# Patient Record
Sex: Female | Born: 1960
Health system: Southern US, Community
[De-identification: ages and names within clinical notes are randomized; demographics above are authoritative.]

## PROBLEM LIST (undated history)

## (undated) DIAGNOSIS — E785 Hyperlipidemia, unspecified: Secondary | ICD-10-CM

## (undated) DIAGNOSIS — M169 Osteoarthritis of hip, unspecified: Secondary | ICD-10-CM

## (undated) HISTORY — DX: Hyperlipidemia, unspecified: E78.5

## (undated) HISTORY — PX: BUNIONECTOMY: SHX129

## (undated) HISTORY — DX: Osteoarthritis of hip, unspecified: M16.9

## (undated) HISTORY — PX: TONSILLECTOMY AND ADENOIDECTOMY: SUR1326

## (undated) HISTORY — PX: OTHER SURGICAL HISTORY: SHX169

---

## 1999-03-17 ENCOUNTER — Emergency Department (HOSPITAL_COMMUNITY): Admission: EM | Admit: 1999-03-17 | Discharge: 1999-03-17 | Payer: Self-pay | Admitting: Emergency Medicine

## 1999-05-30 ENCOUNTER — Other Ambulatory Visit: Admission: RE | Admit: 1999-05-30 | Discharge: 1999-05-30 | Payer: Self-pay | Admitting: Obstetrics and Gynecology

## 2000-05-16 ENCOUNTER — Other Ambulatory Visit: Admission: RE | Admit: 2000-05-16 | Discharge: 2000-05-16 | Payer: Self-pay | Admitting: Obstetrics and Gynecology

## 2001-05-19 ENCOUNTER — Other Ambulatory Visit: Admission: RE | Admit: 2001-05-19 | Discharge: 2001-05-19 | Payer: Self-pay | Admitting: Obstetrics and Gynecology

## 2002-06-02 ENCOUNTER — Other Ambulatory Visit: Admission: RE | Admit: 2002-06-02 | Discharge: 2002-06-02 | Payer: Self-pay | Admitting: Obstetrics and Gynecology

## 2003-07-15 ENCOUNTER — Other Ambulatory Visit: Admission: RE | Admit: 2003-07-15 | Discharge: 2003-07-15 | Payer: Self-pay | Admitting: Obstetrics and Gynecology

## 2004-05-02 ENCOUNTER — Ambulatory Visit: Payer: Self-pay | Admitting: Internal Medicine

## 2004-07-11 ENCOUNTER — Other Ambulatory Visit: Admission: RE | Admit: 2004-07-11 | Discharge: 2004-07-11 | Payer: Self-pay | Admitting: Obstetrics and Gynecology

## 2005-07-23 ENCOUNTER — Other Ambulatory Visit: Admission: RE | Admit: 2005-07-23 | Discharge: 2005-07-23 | Payer: Self-pay | Admitting: Obstetrics and Gynecology

## 2006-02-28 ENCOUNTER — Emergency Department (HOSPITAL_COMMUNITY): Admission: EM | Admit: 2006-02-28 | Discharge: 2006-02-28 | Payer: Self-pay | Admitting: Family Medicine

## 2006-03-18 ENCOUNTER — Emergency Department (HOSPITAL_COMMUNITY): Admission: EM | Admit: 2006-03-18 | Discharge: 2006-03-18 | Payer: Self-pay | Admitting: Emergency Medicine

## 2008-03-10 ENCOUNTER — Encounter: Admission: RE | Admit: 2008-03-10 | Discharge: 2008-03-10 | Payer: Self-pay | Admitting: Obstetrics and Gynecology

## 2008-08-18 ENCOUNTER — Ambulatory Visit: Payer: Self-pay | Admitting: Internal Medicine

## 2008-08-18 LAB — CONVERTED CEMR LAB
HDL goal, serum: 40 mg/dL
LDL Goal: 160 mg/dL

## 2008-08-19 ENCOUNTER — Encounter: Payer: Self-pay | Admitting: Internal Medicine

## 2008-08-26 ENCOUNTER — Ambulatory Visit: Payer: Self-pay | Admitting: Internal Medicine

## 2008-08-29 ENCOUNTER — Encounter: Payer: Self-pay | Admitting: Internal Medicine

## 2008-08-29 ENCOUNTER — Encounter (INDEPENDENT_AMBULATORY_CARE_PROVIDER_SITE_OTHER): Payer: Self-pay | Admitting: *Deleted

## 2010-03-28 ENCOUNTER — Encounter: Payer: Self-pay | Admitting: Internal Medicine

## 2010-03-29 ENCOUNTER — Ambulatory Visit: Payer: Self-pay | Admitting: Internal Medicine

## 2010-03-29 DIAGNOSIS — E785 Hyperlipidemia, unspecified: Secondary | ICD-10-CM

## 2010-04-02 ENCOUNTER — Encounter: Payer: Self-pay | Admitting: Internal Medicine

## 2010-07-15 LAB — CONVERTED CEMR LAB
ALT: 17 units/L (ref 0–35)
Basophils Relative: 1.3 % (ref 0.0–3.0)
Bilirubin, Direct: 0.1 mg/dL (ref 0.0–0.3)
CO2: 28 meq/L (ref 19–32)
Calcium: 8.4 mg/dL (ref 8.4–10.5)
Creatinine, Ser: 0.7 mg/dL (ref 0.4–1.2)
Eosinophils Relative: 6.5 % — ABNORMAL HIGH (ref 0.0–5.0)
Glucose, Bld: 87 mg/dL (ref 70–99)
Hemoglobin: 12.5 g/dL (ref 12.0–15.0)
Lymphocytes Relative: 28.7 % (ref 12.0–46.0)
Monocytes Relative: 7.2 % (ref 3.0–12.0)
Neutro Abs: 2.1 10*3/uL (ref 1.4–7.7)
RBC: 3.85 M/uL — ABNORMAL LOW (ref 3.87–5.11)
Sodium: 141 meq/L (ref 135–145)
TSH: 2.72 microintl units/mL (ref 0.35–5.50)
Total Protein: 6.2 g/dL (ref 6.0–8.3)

## 2010-07-17 NOTE — Assessment & Plan Note (Signed)
Summary: cpx/cbs   Vital Signs:  Patient profile:   50 year old female Height:      64.25 inches (163.19 cm) Weight:      147 pounds (66.82 kg) BMI:     25.13 Temp:     98.2 degrees F (36.78 degrees C) oral Resp:     15 per minute BP sitting:   108 / 70  (left arm)  Vitals Entered By: Lucious Groves CMA (March 29, 2010 12:54 PM)  Is Patient Diabetic? No Pain Assessment Patient in pain? no      Comments Patient notes that she is schedule for mammogram and pap on 05-02-10. Patient flu and Tdap are done through work ([employer])./kb   History of Present Illness: Mrs Tafolla is here for a physical; she is asymptomatic.  Current Medications (verified): 1)  Nortrel 7/7/7 0.5/0.75/1-35 Mg-Mcg Tabs (Norethin-Eth Estrad Triphasic) .... As Directed 2)  Calcium .... Daily 3)  Vitamins .... 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  Past Surgical History: Tonsillectomy ;   G 1 P 1  Family History: Father: MI @ 32 Mother: HTN,dyslipidemia,asthmatic bronchitis Siblings: 3 sisters: dyslipidemia; 1 sister :DM; MGF: colon cancer; MGM : HTN  Social History: Occupation:RN IV, Educator for  [employer] Married Social smoking in college only Alcohol use-yes: socially Regular exercise-yes:weights , CVE 2X/ week  Review of Systems  The patient denies anorexia, fever, weight loss, weight gain, vision loss, decreased hearing, hoarseness, chest pain, syncope, dyspnea on exertion, peripheral edema, prolonged cough, headaches, abdominal pain, melena, hematochezia, severe indigestion/heartburn, hematuria, suspicious skin lesions, depression, unusual weight change, abnormal bleeding, enlarged lymph nodes, and angioedema.         Stool cards negative @ Gyn. Allergy:  Complains of sneezing; denies itching eyes; Intermittent symptoms X 6  months with clear rhinitis, worse in Summer, improving.  Physical Exam  General:  well-nourished;alert,appropriate and cooperative throughout examination Head:  Normocephalic and atraumatic without obvious abnormalities.  Eyes:  No corneal or conjunctival inflammation noted.  Perrla. Funduscopic exam benign, without hemorrhages, exudates or papilledema.  Ears:  External ear exam shows no significant lesions or deformities.  Otoscopic examination reveals clear canals, tympanic membranes are intact bilaterally without bulging, retraction, inflammation or discharge. Hearing is grossly normal bilaterally. Nose:  External nasal examination shows no deformity or inflammation. Nasal mucosa are pink and moist without lesions or exudates. Mouth:  Oral mucosa and oropharynx without lesions or exudates.  Teeth in good repair. Neck:  No deformities, masses, or tenderness noted. ? L cervical rib Lungs:  Normal respiratory effort, chest expands symmetrically. Lungs are clear to auscultation, no crackles or wheezes. Heart:  Normal rate and regular rhythm. S1 and S2 normal without gallop, murmur,  rub . ? intermittent click @ apex Abdomen:  Bowel sounds positive,abdomen soft and non-tender without masses, organomegaly or hernias noted. Genitalia:  Dr Arelia Sneddon Msk:  No deformity or scoliosis noted of thoracic or lumbar spine.   Pulses:  R and L carotid,radial,dorsalis pedis and posterior tibial pulses are full and equal bilaterally Extremities:  No clubbing, cyanosis, edema, or deformity noted with normal full range of motion of all joints.   Neurologic:  alert & oriented X3 and DTRs symmetrical and normal.   Skin:  Intact without suspicious lesions or rashes Cervical Nodes:  No lymphadenopathy noted Axillary Nodes:  No palpable lymphadenopathy Psych:  memory intact for recent and remote, normally interactive, and good eye contact.     Impression & Recommendations:  Problem # 1:  ROUTINE GENERAL MEDICAL  EXAM@[email or web address]  CARE FACL (ICD-V70.0)  Problem # 2:  HYPERLIPIDEMIA (ICD-272.4)  Problem # 3:  FAMILY HISTORY OF ISCHEMIC HEART DISEASE (ICD-V17.3) Father MI @ 61  Complete Medication List: 1)  Nortrel 7/7/7 0.5/0.75/1-35 Mg-mcg Tabs (Norethin-eth estrad triphasic) .... As directed 2)  Calcium  .... Daily 3)  Vitamins  .... Daily  Patient Instructions: 1)  It is important that you exercise regularly at least 20 minutes 5 times a week. If you develop chest pain, have severe difficulty breathing, or feel very tired , stop exercising immediately and seek medical attention. Please FAX ( [phone]) EKG & fasting labs. 2)  Take an 81 mg coated Aspirin every day.Vitamin D3 1000 International Units once daily & Calcium 600 mg two times a day . 3)  Schedule a colonoscopy to help detect colon cancer if insurance allows because of FH of colon cancer.   Not Administered:    Tetanus Vaccine not given due to: will get from employer    Influenza Vaccine # 1 not given due to: will get from employer

## 2010-07-19 NOTE — Letter (Signed)
Summary: Health Screening/Healthgram  Health Screening/[facility]   Imported By: Lanelle Bal 06/02/2010 09:08:16  _____________________________________________________________________  External Attachment:    Type:   Image     Comment:   External Document

## 2011-06-18 HISTORY — PX: COLONOSCOPY: SHX174

## 2011-09-12 ENCOUNTER — Other Ambulatory Visit (HOSPITAL_COMMUNITY): Payer: Self-pay | Admitting: Orthopedic Surgery

## 2011-09-12 DIAGNOSIS — M25551 Pain in right hip: Secondary | ICD-10-CM

## 2011-09-13 ENCOUNTER — Ambulatory Visit (HOSPITAL_COMMUNITY)
Admission: RE | Admit: 2011-09-13 | Discharge: 2011-09-13 | Disposition: A | Discharge status: Home / Self Care | Payer: 59 | Source: Ambulatory Visit | Attending: Orthopedic Surgery | Admitting: Orthopedic Surgery

## 2011-09-13 DIAGNOSIS — M24159 Other articular cartilage disorders, unspecified hip: Secondary | ICD-10-CM | POA: Insufficient documentation

## 2011-09-13 DIAGNOSIS — M25559 Pain in unspecified hip: Secondary | ICD-10-CM | POA: Insufficient documentation

## 2011-09-13 DIAGNOSIS — M2480 Other specific joint derangements of unspecified joint, not elsewhere classified: Secondary | ICD-10-CM | POA: Insufficient documentation

## 2011-09-13 DIAGNOSIS — M25551 Pain in right hip: Secondary | ICD-10-CM

## 2011-09-13 DIAGNOSIS — M24859 Other specific joint derangements of unspecified hip, not elsewhere classified: Secondary | ICD-10-CM | POA: Insufficient documentation

## 2011-09-20 ENCOUNTER — Encounter: Payer: Self-pay | Admitting: Internal Medicine

## 2011-10-31 ENCOUNTER — Encounter: Payer: Self-pay | Admitting: Internal Medicine

## 2011-12-16 ENCOUNTER — Encounter: Payer: Self-pay | Admitting: Internal Medicine

## 2011-12-16 ENCOUNTER — Ambulatory Visit (INDEPENDENT_AMBULATORY_CARE_PROVIDER_SITE_OTHER): Payer: 59 | Admitting: Internal Medicine

## 2011-12-16 VITALS — BP 122/64 | HR 68 | Temp 98.1°F | Resp 12 | Ht 64.08 in | Wt 149.6 lb

## 2011-12-16 DIAGNOSIS — Z23 Encounter for immunization: Secondary | ICD-10-CM

## 2011-12-16 DIAGNOSIS — Z1211 Encounter for screening for malignant neoplasm of colon: Secondary | ICD-10-CM

## 2011-12-16 DIAGNOSIS — Z0000 Encounter for general adult medical examination without abnormal findings: Secondary | ICD-10-CM

## 2011-12-16 DIAGNOSIS — E785 Hyperlipidemia, unspecified: Secondary | ICD-10-CM

## 2011-12-16 NOTE — Patient Instructions (Addendum)
Preventive Health Care: Eat a low-fat diet with lots of fruits and vegetables, up to 7-9 servings per day.  Consume less than 30 grams of sugar per day from foods & drinks with High Fructose Corn Syrup as #1,2,3 or #4 on label. Health Care Power of Attorney & Living Will place you in charge of your health care  decisions. Verify these are  in place.  As per the Standard of Care , screening Colonoscopy recommended @ 50 & every 5-10 years thereafter . More frequent monitor would be dictated by family history or findings @ Colonoscopy. Please  schedule fasting Labs : BMET,Lipids, hepatic panel, CBC & dif, TSH. Code : V70.0.  Please try to go on My Chart within the next 24 hours to allow me to release the results directly to you.

## 2011-12-16 NOTE — Progress Notes (Signed)
  Subjective:    Patient ID: Shelby Calhoun, female    DOB: Feb 02, 1961, 51 y.o.   MRN: [id number]  HPI Shelby Calhoun is here for a physical;acute issues DJD of R hip.      Review of Systems She has seen Shelby Calhoun who referred her to [facility] see Shelby Calhoun. He felt arthroscopy was not indicated and that she would need a total hip replacement. She does take glucosamine sulfate  Her advanced cholesterol panel was reviewed; there is risk with LDL greater than 100.There is  a family history of premature coronary disease in her father.    Objective:   Physical Exam Gen.: Thin healthy and well-nourished in appearance. Alert, appropriate and cooperative throughout exam. Head: Normocephalic without obvious abnormalities  Eyes: No corneal or conjunctival inflammation noted. Pupils equal round reactive to light and accommodation. Fundal exam is benign without hemorrhages, exudate, papilledema. Extraocular motion intact. Vision grossly normal. Ears: External  ear exam reveals no significant lesions or deformities. Canals clear .TMs normal. Hearing is grossly normal bilaterally. Nose: External nasal exam reveals no deformity or inflammation. Nasal mucosa are pink and moist. No lesions or exudates noted.   Mouth: Oral mucosa and oropharynx reveal no lesions or exudates. Teeth in good repair. Neck: No deformities, masses, or tenderness noted. Range of motion&  Thyroid normal. Lungs: Normal respiratory effort; chest expands symmetrically. Lungs are clear to auscultation without rales, wheezes, or increased work of breathing. Heart: Normal rate and rhythm. Normal S1 and S2. No gallop, click, or rub. S4 with slurring ; no  murmur. Abdomen: Bowel sounds normal; abdomen soft and nontender. No masses, organomegaly or hernias noted. Genitalia: Shelby Calhoun                                                         Musculoskeletal/extremities: No deformity or scoliosis noted of  the thoracic or lumbar spine. No  clubbing, cyanosis, edema, or deformity noted. Range of motion  normal .Tone & strength  normal.Joints normal. Nail health  good. Vascular: Carotid, radial artery, dorsalis pedis and  posterior tibial pulses are full and equal. No bruits present. Neurologic: Alert and oriented x3. Deep tendon reflexes symmetrical and normal.         Skin: Intact without suspicious lesions or rashes. Lymph: No cervical, axillary lymphadenopathy present. Psych: Mood and affect are normal. Normally interactive                                                                                         Assessment & Plan:  #1 comprehensive physical exam; no acute findings #2 see Problem List with Assessments & Recommendations #3 degenerative joint disease right hip; no clinical contraindication to surgery Plan: see Orders

## 2011-12-17 ENCOUNTER — Encounter: Payer: Self-pay | Admitting: Internal Medicine

## 2012-01-23 ENCOUNTER — Ambulatory Visit (AMBULATORY_SURGERY_CENTER): Payer: 59

## 2012-01-23 VITALS — Ht 64.0 in | Wt 151.1 lb

## 2012-01-23 DIAGNOSIS — Z1211 Encounter for screening for malignant neoplasm of colon: Secondary | ICD-10-CM

## 2012-01-23 MED ORDER — MOVIPREP 100 G PO SOLR: ORAL | Status: DC

## 2012-02-06 ENCOUNTER — Encounter: Payer: 59 | Admitting: Internal Medicine

## 2012-02-06 ENCOUNTER — Encounter: Payer: Self-pay | Admitting: Internal Medicine

## 2012-02-06 ENCOUNTER — Ambulatory Visit (AMBULATORY_SURGERY_CENTER): Payer: 59 | Admitting: Internal Medicine

## 2012-02-06 VITALS — BP 115/63 | HR 63 | Temp 99.3°F | Resp 15 | Ht 64.0 in | Wt 151.0 lb

## 2012-02-06 DIAGNOSIS — Z1211 Encounter for screening for malignant neoplasm of colon: Secondary | ICD-10-CM

## 2012-02-06 MED ORDER — SODIUM CHLORIDE 0.9 % IV SOLN: 500.0000 mL | INTRAVENOUS | Status: DC

## 2012-02-06 NOTE — Patient Instructions (Addendum)
The colonoscopy was normal! Excellent prep also.  Thank you for choosing me and [facility].  Iva Boop, MD, FACG YOU HAD AN ENDOSCOPIC PROCEDURE TODAY AT THE [facility]: Refer to the procedure report that was given to you for any specific questions about what was found during the examination.  If the procedure report does not answer your questions, please call your gastroenterologist to clarify.  If you requested that your care partner not be given the details of your procedure findings, then the procedure report has been included in a sealed envelope for you to review at your convenience later.  YOU SHOULD EXPECT: Some feelings of bloating in the abdomen. Passage of more gas than usual.  Walking can help get rid of the air that was put into your GI tract during the procedure and reduce the bloating. If you had a lower endoscopy (such as a colonoscopy or flexible sigmoidoscopy) you may notice spotting of blood in your stool or on the toilet paper. If you underwent a bowel prep for your procedure, then you may not have a normal bowel movement for a few days.  DIET: Your first meal following the procedure should be a light meal and then it is ok to progress to your normal diet.  A half-sandwich or bowl of soup is an example of a good first meal.  Heavy or fried foods are harder to digest and may make you feel nauseous or bloated.  Likewise meals heavy in dairy and vegetables can cause extra gas to form and this can also increase the bloating.  Drink plenty of fluids but you should avoid alcoholic beverages for 24 hours.  ACTIVITY: Your care partner should take you home directly after the procedure.  You should plan to take it easy, moving slowly for the rest of the day.  You can resume normal activity the day after the procedure however you should NOT DRIVE or use heavy machinery for 24 hours (because of the sedation medicines used during the test).    SYMPTOMS TO REPORT  IMMEDIATELY: A gastroenterologist can be reached at any hour.  During normal business hours, 8:30 AM to 5:00 PM Monday through Friday, call [phone].  After hours and on weekends, please call the GI answering service at [phone] who will take a message and have the physician on call contact you.   Following lower endoscopy (colonoscopy or flexible sigmoidoscopy):  Excessive amounts of blood in the stool  Significant tenderness or worsening of abdominal pains  Swelling of the abdomen that is new, acute  Fever of 100F or higher  FOLLOW UP: If any biopsies were taken you will be contacted by phone or by letter within the next 1-3 weeks.  Call your gastroenterologist if you have not heard about the biopsies in 3 weeks.  Our staff will call the home number listed on your records the next business day following your procedure to check on you and address any questions or concerns that you may have at that time regarding the information given to you following your procedure. This is a courtesy call and so if there is no answer at the home number and we have not heard from you through the emergency physician on call, we will assume that you have returned to your regular daily activities without incident.  SIGNATURES/CONFIDENTIALITY: You and/or your care partner have signed paperwork which will be entered into your electronic medical record.  These signatures attest to the fact that that  the information above on your After Visit Summary has been reviewed and is understood.  Full responsibility of the confidentiality of this discharge information lies with you and/or your care-partner.

## 2012-02-06 NOTE — Op Note (Signed)
[facility] [address] [place], [address]   COLONOSCOPY PROCEDURE REPORT  PATIENT: Calhoun, Shelby Sinnett  MR#: [id number] BIRTHDATE: May 11, 1961 , 50  yrs. old GENDER: Female ENDOSCOPIST: Iva Boop, MD, [facility] REFERRED XB:[id number] Alwyn Ren, M.D. PROCEDURE DATE:  02/06/2012 PROCEDURE:   Colonoscopy, screening ASA CLASS:   Class I INDICATIONS:average risk screening. MEDICATIONS: propofol (Diprivan) 350mg  IV, MAC sedation, administered by CRNA, and These medications were titrated to patient response per physician's verbal order  DESCRIPTION OF PROCEDURE:   After the risks benefits and alternatives of the procedure were thoroughly explained, informed consent was obtained.  A digital rectal exam revealed no abnormalities of the rectum.   The LB [id number]  endoscope was introduced through the anus and advanced to the cecum, which was identified by both the appendix and ileocecal valve. No adverse events experienced.   The quality of the prep was excellent, using MoviPrep  The instrument was then slowly withdrawn as the colon was fully examined.      COLON FINDINGS: A normal appearing cecum, ileocecal valve, and appendiceal orifice were identified.  The ascending, hepatic flexure, transverse, splenic flexure, descending, sigmoid colon and rectum appeared unremarkable.  No polyps or cancers were seen. Retroflexed views in the rectum and right colon revealed no abnormalities. The time to cecum=3 minutes 49 seconds  Withdrawal time=15 minutes 00 seconds.  The scope was withdrawn and the procedure completed. COMPLICATIONS: There were no complications. ENDOSCOPIC IMPRESSION: Normal colon and rectum with excellent prep  RECOMMENDATIONS: repeat Colonscopy in 10 years.  eSigned:  Iva Boop, MD, [facility] 02/06/2012 3:39 PM   cc: Pecola Lawless, MD and The Patient

## 2012-02-06 NOTE — Progress Notes (Addendum)
Patient did not have preoperative order for IV antibiotic SSI prophylaxis. (G8918)  Patient did not experience any of the following events: a burn prior to discharge; a fall within the facility; wrong site/side/patient/procedure/implant event; or a hospital transfer or hospital admission upon discharge from the facility. (G8907)  

## 2012-02-07 ENCOUNTER — Telehealth: Payer: Self-pay | Admitting: *Deleted

## 2012-02-07 NOTE — Telephone Encounter (Signed)
  Follow up Call-  Call back number 02/06/2012  Post procedure Call Back phone  # [phone]  Permission to leave phone message Yes     Patient questions:  Do you have a fever, pain , or abdominal swelling? no Pain Score  0 *  Have you tolerated food without any problems? yes  Have you been able to return to your normal activities? yes  Do you have any questions about your discharge instructions: Diet   no Medications  no Follow up visit  no  Do you have questions or concerns about your Care? no  Actions: * If pain score is 4 or above: No action needed, pain <4.

## 2012-04-03 ENCOUNTER — Telehealth: Payer: Self-pay

## 2012-04-03 NOTE — Telephone Encounter (Signed)
error 

## 2012-04-20 ENCOUNTER — Encounter (HOSPITAL_COMMUNITY): Payer: Self-pay | Admitting: Pharmacy Technician

## 2012-04-23 ENCOUNTER — Encounter (HOSPITAL_COMMUNITY)
Admission: RE | Admit: 2012-04-23 | Discharge: 2012-04-23 | Disposition: A | Discharge status: Home / Self Care | Payer: 59 | Source: Ambulatory Visit | Attending: Orthopedic Surgery | Admitting: Orthopedic Surgery

## 2012-04-23 ENCOUNTER — Encounter (HOSPITAL_COMMUNITY): Payer: Self-pay

## 2012-04-23 LAB — URINALYSIS, ROUTINE W REFLEX MICROSCOPIC
Bilirubin Urine: NEGATIVE
Hgb urine dipstick: NEGATIVE
Ketones, ur: 40 mg/dL — AB
Nitrite: NEGATIVE
Urobilinogen, UA: 1 mg/dL (ref 0.0–1.0)
pH: 6 (ref 5.0–8.0)

## 2012-04-23 LAB — CBC
MCH: 31.7 pg (ref 26.0–34.0)
MCHC: 33.9 g/dL (ref 30.0–36.0)
RDW: 11.4 % — ABNORMAL LOW (ref 11.5–15.5)

## 2012-04-23 LAB — BASIC METABOLIC PANEL
BUN: 10 mg/dL (ref 6–23)
Creatinine, Ser: 0.67 mg/dL (ref 0.50–1.10)
GFR calc Af Amer: >90 mL/min (ref >90)
GFR calc non Af Amer: >90 mL/min (ref >90)
Glucose, Bld: 92 mg/dL (ref 70–99)

## 2012-04-23 LAB — PROTIME-INR
INR: 0.96 (ref 0.00–1.49)
Prothrombin Time: 12.7 seconds (ref 11.6–15.2)

## 2012-04-23 LAB — SURGICAL PCR SCREEN
MRSA, PCR: POSITIVE — AB
Staphylococcus aureus: POSITIVE — AB

## 2012-04-23 LAB — HCG, SERUM, QUALITATIVE: Preg, Serum: NEGATIVE

## 2012-04-23 NOTE — H&P (Signed)
TOTAL HIP ADMISSION H&P  Patient is admitted for right total hip arthroplasty, anterior approach.  Subjective:  Chief Complaint:    Right hip OA / pain  HPI: Shelby Calhoun, 51 y.o. female, has a history of pain and functional disability in the right hip(s) due to arthritis and patient has failed non-surgical conservative treatments for greater than 12 weeks to include NSAID's and/or analgesics, corticosteriod injections and activity modification.  Onset of symptoms was gradual starting 2 years ago with rapidlly worsening course since that time.The patient noted no past surgery on the right hip(s).  Patient currently rates pain in the right hip at 7 out of 10 with activity. Patient has worsening of pain with activity and weight bearing, pain that interfers with activities of daily living and pain with passive range of motion. Patient has evidence of periarticular osteophytes and joint space narrowing by imaging studies. This condition presents safety issues increasing the risk of falls. Risks, benefits and expectations were discussed with the patient. Patient understand the risks, benefits and expectations and wishes to proceed with surgery.   D/C Plans:  Home with HHPT  Post-op Meds:  Rx given for ASA, Robaxin, Celebrex, Iron, Colace and MiraLax  Tranexamic Acid:  To be given  Decadron:   To be given   Patient Active Problem List   Diagnosis Date Noted  . HYPERLIPIDEMIA 03/29/2010   Past Medical History  Diagnosis Date  . DJD (degenerative joint disease) of hip     Dr Despina Hick    Past Surgical History  Procedure Date  . Tonsillectomy and adenoidectomy   . Bunionectomy     Dr Ralene Cork  . G 3 p 1     No prescriptions prior to admission   No Known Allergies  History  Substance Use Topics  . Smoking status: Never Smoker   . Smokeless tobacco: Never Used  . Alcohol Use: 2.4 oz/week    4 Glasses of wine per week     Comment:      Family History  Problem Relation Age of Onset    . Stroke Mother 27  . Hypertension Mother   . Hyperlipidemia Mother   . Osteoporosis Mother   . Heart disease Father     MI @ 4  . Cancer Sister     breast  . Hyperlipidemia Sister   . Hypertension Sister   . Breast cancer Sister   . Cancer Maternal Grandfather     abdominal  . Diabetes Sister      Review of Systems  Constitutional: Negative.   HENT: Negative.   Eyes: Negative.   Respiratory: Negative.   Cardiovascular: Negative.   Gastrointestinal: Negative.   Genitourinary: Negative.   Musculoskeletal: Positive for joint pain.  Skin: Negative.   Neurological: Negative.   Endo/Heme/Allergies: Negative.   Psychiatric/Behavioral: Negative.     Objective:  Physical Exam  Constitutional: She is oriented to person, place, and time. She appears well-developed and well-nourished.  HENT:  Head: Normocephalic and atraumatic.  Mouth/Throat: Oropharynx is clear and moist.  Eyes: Pupils are equal, round, and reactive to light.  Neck: Neck supple. No JVD present. No tracheal deviation present. No thyromegaly present.  Cardiovascular: Normal rate, regular rhythm, normal heart sounds and intact distal pulses.   Respiratory: Effort normal and breath sounds normal. No stridor. No respiratory distress. She has no wheezes.  GI: Soft. There is no tenderness. There is no guarding.  Musculoskeletal:       Right hip: She exhibits decreased  range of motion, decreased strength, tenderness and bony tenderness. She exhibits no swelling, no deformity and no laceration.  Lymphadenopathy:    She has no cervical adenopathy.  Neurological: She is alert and oriented to person, place, and time.  Skin: Skin is warm and dry.  Psychiatric: She has a normal mood and affect.    Vital signs in last 24 hours: Temp:  [98.1 F (36.7 C)] 98.1 F (36.7 C) (11/07 1100) Pulse Rate:  [68] 68  (11/07 1100) Resp:  [18] 18  (11/07 1100) BP: (125)/(78) 125/78 mmHg (11/07 1100) SpO2:  [99 %] 99 % (11/07  1100) Weight:  [68.04 kg (150 lb)] 68.04 kg (150 lb) (11/07 1100)  Labs:   Estimated Body mass index is 25.92 kg/(m^2) as calculated from the following:   Height as of 02/06/12: 5\' 4" (1.626 m).   Weight as of 02/06/12: 151 lb(68.493 kg).   Imaging Review Plain radiographs demonstrate severe degenerative joint disease of the right hip(s). The bone quality appears to be good for age and reported activity level.  Assessment/Plan:  End stage arthritis, right hip(s)  The patient history, physical examination, clinical judgement of the provider and imaging studies are consistent with end stage degenerative joint disease of the right hip(s) and total hip arthroplasty is deemed medically necessary. The treatment options including medical management, injection therapy, arthroscopy and arthroplasty were discussed at length. The risks and benefits of total hip arthroplasty were presented and reviewed. The risks due to aseptic loosening, infection, stiffness, dislocation/subluxation,  thromboembolic complications and other imponderables were discussed.  The patient acknowledged the explanation, agreed to proceed with the plan and consent was signed. Patient is being admitted for inpatient treatment for surgery, pain control, PT, OT, prophylactic antibiotics, VTE prophylaxis, progressive ambulation and ADL's and discharge planning.The patient is planning to be discharged home with home health services.    Shelby Calhoun   PAC  04/23/2012, 3:10 PM

## 2012-04-23 NOTE — Pre-Procedure Instructions (Signed)
[id number] Pt. Made aware of Positive -PCR screen for MRSA-will use RX. Mupirocin as directed and aware visitor and staff will observe Contact Isolation. W. Kennon Portela

## 2012-04-23 NOTE — Patient Instructions (Addendum)
[address]  04/23/2012   Your procedure is scheduled on: 11-12  -2013  Report to [facility] at     11:30   AM.  Call this number if you have problems the morning of surgery: [phone]  Or Presurgical Testing [phone]([facility])   Remember:   Do not eat food:After Midnight.  May have clear liquids:up to 6 Hours before arrival. Nothing after :0800 AM  Clear liquids include soda, tea, black coffee, apple or grape juice, broth.  Take these medicines the morning of surgery with A SIP OF WATER:  none   Do not wear jewelry, make-up or nail polish.  Do not wear lotions, powders, or perfumes. You may wear deodorant.  Do not shave 48 hours prior to surgery.(face and neck okay, no shaving of legs)  Do not bring valuables to the hospital.  Contacts, dentures or bridgework may not be worn into surgery.  Leave suitcase in the car. After surgery it may be brought to your room.  For patients admitted to the hospital, checkout time is 11:00 AM the day of discharge.   Patients discharged the day of surgery will not be allowed to drive home. Must have responsible person with you x 24 hours once discharged.  Name and phone number of your driver: Brett Canales ,spouse [phone] cell  Special Instructions: CHG Shower Use Special Wash: see special instruction sheet.(avoid face and genitals)   Please read over the following fact sheets that you were given: MRSA Information, Blood Transfusion fact sheet, Incentive Spirometry Instruction.

## 2012-04-27 NOTE — Progress Notes (Signed)
Spoke with pts Spouse- Brett Canales re: surgery time change @4 :15pm, arrival time 1:45pm

## 2012-04-28 ENCOUNTER — Inpatient Hospital Stay (HOSPITAL_COMMUNITY): Payer: 59

## 2012-04-28 ENCOUNTER — Encounter (HOSPITAL_COMMUNITY): Payer: Self-pay | Admitting: Anesthesiology

## 2012-04-28 ENCOUNTER — Encounter (HOSPITAL_COMMUNITY): Payer: Self-pay

## 2012-04-28 ENCOUNTER — Encounter (HOSPITAL_COMMUNITY)
Admission: RE | Disposition: A | Discharge status: Home Health | Payer: Self-pay | Source: Ambulatory Visit | Attending: Orthopedic Surgery

## 2012-04-28 ENCOUNTER — Inpatient Hospital Stay (HOSPITAL_COMMUNITY): Payer: 59 | Admitting: Anesthesiology

## 2012-04-28 ENCOUNTER — Encounter (HOSPITAL_COMMUNITY): Payer: Self-pay | Admitting: *Deleted

## 2012-04-28 ENCOUNTER — Inpatient Hospital Stay (HOSPITAL_COMMUNITY)
Admission: RE | Admit: 2012-04-28 | Discharge: 2012-04-30 | DRG: 470 | Disposition: A | Discharge status: Home Health | Payer: 59 | Source: Ambulatory Visit | Attending: Orthopedic Surgery | Admitting: Orthopedic Surgery

## 2012-04-28 DIAGNOSIS — D62 Acute posthemorrhagic anemia: Secondary | ICD-10-CM | POA: Diagnosis not present

## 2012-04-28 DIAGNOSIS — Z01812 Encounter for preprocedural laboratory examination: Secondary | ICD-10-CM

## 2012-04-28 DIAGNOSIS — E785 Hyperlipidemia, unspecified: Secondary | ICD-10-CM | POA: Diagnosis present

## 2012-04-28 DIAGNOSIS — M1610 Unilateral primary osteoarthritis, unspecified hip: Principal | ICD-10-CM | POA: Diagnosis present

## 2012-04-28 DIAGNOSIS — D500 Iron deficiency anemia secondary to blood loss (chronic): Secondary | ICD-10-CM

## 2012-04-28 DIAGNOSIS — Z96649 Presence of unspecified artificial hip joint: Secondary | ICD-10-CM

## 2012-04-28 DIAGNOSIS — M169 Osteoarthritis of hip, unspecified: Principal | ICD-10-CM | POA: Diagnosis present

## 2012-04-28 HISTORY — PX: TOTAL HIP ARTHROPLASTY: SHX124

## 2012-04-28 LAB — ABO/RH: ABO/RH(D): A POS

## 2012-04-28 LAB — TYPE AND SCREEN

## 2012-04-28 SURGERY — ARTHROPLASTY, HIP, TOTAL, ANTERIOR APPROACH
Anesthesia: General | Site: Hip | Laterality: Right | Wound class: Clean

## 2012-04-28 MED ORDER — LIDOCAINE HCL (CARDIAC) 20 MG/ML IV SOLN
INTRAVENOUS | Status: DC | PRN
  Administered 2012-04-28: 50 mg via INTRAVENOUS

## 2012-04-28 MED ORDER — TRANEXAMIC ACID 100 MG/ML IV SOLN
1000.0000 mg | Freq: Once | INTRAVENOUS | Status: AC
  Administered 2012-04-28: 1000 mg via INTRAVENOUS
  Filled 2012-04-28: qty 10

## 2012-04-28 MED ORDER — HYDROMORPHONE HCL PF 1 MG/ML IJ SOLN
INTRAMUSCULAR | Status: AC
  Filled 2012-04-28: qty 1

## 2012-04-28 MED ORDER — MIDAZOLAM HCL 5 MG/5ML IJ SOLN
INTRAMUSCULAR | Status: DC | PRN
  Administered 2012-04-28: 2 mg via INTRAVENOUS

## 2012-04-28 MED ORDER — ZOLPIDEM TARTRATE 5 MG PO TABS: 5.0000 mg | ORAL_TABLET | Freq: Every evening | ORAL | Status: DC | PRN

## 2012-04-28 MED ORDER — ONDANSETRON HCL 4 MG/2ML IJ SOLN: 4.0000 mg | Freq: Four times a day (QID) | INTRAMUSCULAR | Status: DC | PRN

## 2012-04-28 MED ORDER — BISACODYL 10 MG RE SUPP: 10.0000 mg | Freq: Every day | RECTAL | Status: DC | PRN

## 2012-04-28 MED ORDER — ALUM & MAG HYDROXIDE-SIMETH 200-200-20 MG/5ML PO SUSP: 30.0000 mL | ORAL | Status: DC | PRN

## 2012-04-28 MED ORDER — NORGESTREL-ETHINYL ESTRADIOL 0.3-30 MG-MCG PO TABS
1.0000 | ORAL_TABLET | Freq: Every day | ORAL | Status: DC
  Administered 2012-04-29 – 2012-04-30 (×2): 1 via ORAL

## 2012-04-28 MED ORDER — CEFAZOLIN SODIUM-DEXTROSE 2-3 GM-% IV SOLR
2.0000 g | INTRAVENOUS | Status: AC
  Administered 2012-04-28: 2 g via INTRAVENOUS

## 2012-04-28 MED ORDER — LACTATED RINGERS IV SOLN
INTRAVENOUS | Status: DC | PRN
  Administered 2012-04-28 (×3): via INTRAVENOUS

## 2012-04-28 MED ORDER — PROPOFOL 10 MG/ML IV BOLUS
INTRAVENOUS | Status: DC | PRN
  Administered 2012-04-28: 150 mg via INTRAVENOUS

## 2012-04-28 MED ORDER — PHENOL 1.4 % MT LIQD: 1.0000 | OROMUCOSAL | Status: DC | PRN

## 2012-04-28 MED ORDER — DEXAMETHASONE SODIUM PHOSPHATE 10 MG/ML IJ SOLN
10.0000 mg | Freq: Once | INTRAMUSCULAR | Status: AC
  Administered 2012-04-28: 10 mg via INTRAVENOUS

## 2012-04-28 MED ORDER — CELECOXIB 200 MG PO CAPS
200.0000 mg | ORAL_CAPSULE | Freq: Two times a day (BID) | ORAL | Status: DC
  Administered 2012-04-28 – 2012-04-30 (×4): 200 mg via ORAL
  Filled 2012-04-28 (×5): qty 1

## 2012-04-28 MED ORDER — POLYETHYLENE GLYCOL 3350 17 G PO PACK
17.0000 g | PACK | Freq: Two times a day (BID) | ORAL | Status: DC
  Administered 2012-04-29 – 2012-04-30 (×3): 17 g via ORAL

## 2012-04-28 MED ORDER — EPHEDRINE SULFATE 50 MG/ML IJ SOLN
INTRAMUSCULAR | Status: DC | PRN
  Administered 2012-04-28 (×2): 10 mg via INTRAVENOUS

## 2012-04-28 MED ORDER — CHLORHEXIDINE GLUCONATE 4 % EX LIQD
60.0000 mL | Freq: Once | CUTANEOUS | Status: DC
  Filled 2012-04-28: qty 60

## 2012-04-28 MED ORDER — ONDANSETRON HCL 4 MG PO TABS: 4.0000 mg | ORAL_TABLET | Freq: Four times a day (QID) | ORAL | Status: DC | PRN

## 2012-04-28 MED ORDER — FERROUS SULFATE 325 (65 FE) MG PO TABS
325.0000 mg | ORAL_TABLET | Freq: Three times a day (TID) | ORAL | Status: DC
  Administered 2012-04-29 – 2012-04-30 (×3): 325 mg via ORAL
  Filled 2012-04-28 (×8): qty 1

## 2012-04-28 MED ORDER — CEFAZOLIN SODIUM-DEXTROSE 2-3 GM-% IV SOLR
2.0000 g | Freq: Four times a day (QID) | INTRAVENOUS | Status: AC
  Administered 2012-04-28 – 2012-04-29 (×2): 2 g via INTRAVENOUS
  Filled 2012-04-28 (×2): qty 50

## 2012-04-28 MED ORDER — GLYCOPYRROLATE 0.2 MG/ML IJ SOLN
INTRAMUSCULAR | Status: DC | PRN
  Administered 2012-04-28: 0.4 mg via INTRAVENOUS
  Administered 2012-04-28: 0.2 mg via INTRAVENOUS

## 2012-04-28 MED ORDER — DOCUSATE SODIUM 100 MG PO CAPS
100.0000 mg | ORAL_CAPSULE | Freq: Two times a day (BID) | ORAL | Status: DC
  Administered 2012-04-29 – 2012-04-30 (×3): 100 mg via ORAL

## 2012-04-28 MED ORDER — DEXAMETHASONE SODIUM PHOSPHATE 10 MG/ML IJ SOLN
10.0000 mg | Freq: Once | INTRAMUSCULAR | Status: AC
  Administered 2012-04-29: 10 mg via INTRAVENOUS
  Filled 2012-04-28: qty 1

## 2012-04-28 MED ORDER — LACTATED RINGERS IV SOLN: INTRAVENOUS | Status: DC

## 2012-04-28 MED ORDER — HYDROMORPHONE HCL PF 1 MG/ML IJ SOLN
0.2500 mg | INTRAMUSCULAR | Status: DC | PRN
  Administered 2012-04-28: 0.5 mg via INTRAVENOUS
  Administered 2012-04-28 (×4): 0.25 mg via INTRAVENOUS

## 2012-04-28 MED ORDER — NEOSTIGMINE METHYLSULFATE 1 MG/ML IJ SOLN
INTRAMUSCULAR | Status: DC | PRN
  Administered 2012-04-28: 3 mg via INTRAVENOUS

## 2012-04-28 MED ORDER — HYDROCODONE-ACETAMINOPHEN 7.5-325 MG PO TABS
1.0000 | ORAL_TABLET | ORAL | Status: DC
  Administered 2012-04-28 – 2012-04-29 (×3): 1 via ORAL
  Filled 2012-04-28 (×3): qty 2

## 2012-04-28 MED ORDER — METHOCARBAMOL 100 MG/ML IJ SOLN
500.0000 mg | Freq: Four times a day (QID) | INTRAVENOUS | Status: DC | PRN
  Administered 2012-04-28 (×2): 500 mg via INTRAVENOUS
  Filled 2012-04-28 (×3): qty 5

## 2012-04-28 MED ORDER — ROCURONIUM BROMIDE 100 MG/10ML IV SOLN
INTRAVENOUS | Status: DC | PRN
  Administered 2012-04-28: 50 mg via INTRAVENOUS

## 2012-04-28 MED ORDER — FENTANYL CITRATE 0.05 MG/ML IJ SOLN
INTRAMUSCULAR | Status: DC | PRN
  Administered 2012-04-28 (×2): 100 ug via INTRAVENOUS

## 2012-04-28 MED ORDER — METHOCARBAMOL 500 MG PO TABS
500.0000 mg | ORAL_TABLET | Freq: Four times a day (QID) | ORAL | Status: DC | PRN
  Administered 2012-04-29 – 2012-04-30 (×3): 500 mg via ORAL
  Filled 2012-04-28 (×3): qty 1

## 2012-04-28 MED ORDER — DIPHENHYDRAMINE HCL 25 MG PO CAPS: 25.0000 mg | ORAL_CAPSULE | Freq: Four times a day (QID) | ORAL | Status: DC | PRN

## 2012-04-28 MED ORDER — ACETAMINOPHEN 10 MG/ML IV SOLN
INTRAVENOUS | Status: DC | PRN
  Administered 2012-04-28: 1000 mg via INTRAVENOUS

## 2012-04-28 MED ORDER — HYDROMORPHONE HCL PF 1 MG/ML IJ SOLN: 0.5000 mg | INTRAMUSCULAR | Status: DC | PRN

## 2012-04-28 MED ORDER — SODIUM CHLORIDE 0.9 % IV SOLN
100.0000 mL/h | INTRAVENOUS | Status: DC
  Administered 2012-04-28: 100 mL/h via INTRAVENOUS
  Filled 2012-04-28 (×7): qty 1000

## 2012-04-28 MED ORDER — 0.9 % SODIUM CHLORIDE (POUR BTL) OPTIME
TOPICAL | Status: DC | PRN
  Administered 2012-04-28: 1000 mL

## 2012-04-28 MED ORDER — FLEET ENEMA 7-19 GM/118ML RE ENEM: 1.0000 | ENEMA | Freq: Once | RECTAL | Status: AC | PRN

## 2012-04-28 MED ORDER — METOCLOPRAMIDE HCL 5 MG PO TABS
5.0000 mg | ORAL_TABLET | Freq: Three times a day (TID) | ORAL | Status: DC | PRN
  Filled 2012-04-28: qty 2

## 2012-04-28 MED ORDER — MENTHOL 3 MG MT LOZG: 1.0000 | LOZENGE | OROMUCOSAL | Status: DC | PRN

## 2012-04-28 MED ORDER — ONDANSETRON HCL 4 MG/2ML IJ SOLN
INTRAMUSCULAR | Status: DC | PRN
  Administered 2012-04-28: 4 mg via INTRAVENOUS

## 2012-04-28 MED ORDER — METOCLOPRAMIDE HCL 5 MG/ML IJ SOLN
5.0000 mg | Freq: Three times a day (TID) | INTRAMUSCULAR | Status: DC | PRN
Start: 2012-04-28 — End: 2012-04-30
  Administered 2012-04-28: 10 mg via INTRAVENOUS
  Filled 2012-04-28: qty 2

## 2012-04-28 MED ORDER — HYDROMORPHONE HCL PF 1 MG/ML IJ SOLN
INTRAMUSCULAR | Status: DC | PRN
  Administered 2012-04-28 (×2): 1 mg via INTRAVENOUS

## 2012-04-28 MED ORDER — RIVAROXABAN 10 MG PO TABS
10.0000 mg | ORAL_TABLET | ORAL | Status: DC
  Administered 2012-04-29 – 2012-04-30 (×2): 10 mg via ORAL
  Filled 2012-04-28 (×3): qty 1

## 2012-04-28 SURGICAL SUPPLY — 46 items
ADH SKN CLS APL DERMABOND .7 (GAUZE/BANDAGES/DRESSINGS) ×1
BAG SPEC THK2 15X12 ZIP CLS (MISCELLANEOUS) ×2
BAG ZIPLOCK 12X15 (MISCELLANEOUS) ×4 IMPLANT
BLADE SAW SGTL 18X1.27X75 (BLADE) ×2 IMPLANT
CELLS DAT CNTRL 66122 CELL SVR (MISCELLANEOUS) ×1 IMPLANT
CLOTH BEACON ORANGE TIMEOUT ST (SAFETY) ×2 IMPLANT
DERMABOND ADVANCED (GAUZE/BANDAGES/DRESSINGS) ×1
DERMABOND ADVANCED .7 DNX12 (GAUZE/BANDAGES/DRESSINGS) ×1 IMPLANT
DRAPE C-ARM 42X72 X-RAY (DRAPES) ×2 IMPLANT
DRAPE STERI IOBAN 125X83 (DRAPES) ×2 IMPLANT
DRAPE U-SHAPE 47X51 STRL (DRAPES) ×6 IMPLANT
DRSG AQUACEL AG ADV 3.5X10 (GAUZE/BANDAGES/DRESSINGS) ×2 IMPLANT
DRSG TEGADERM 4X4.75 (GAUZE/BANDAGES/DRESSINGS) ×1 IMPLANT
DURAPREP 26ML APPLICATOR (WOUND CARE) ×2 IMPLANT
ELECT BLADE TIP CTD 4 INCH (ELECTRODE) ×2 IMPLANT
ELECT REM PT RETURN 9FT ADLT (ELECTROSURGICAL) ×2
ELECTRODE REM PT RTRN 9FT ADLT (ELECTROSURGICAL) ×1 IMPLANT
EVACUATOR 1/8 PVC DRAIN (DRAIN) IMPLANT
FACESHIELD LNG OPTICON STERILE (SAFETY) ×8 IMPLANT
GAUZE SPONGE 2X2 8PLY STRL LF (GAUZE/BANDAGES/DRESSINGS) ×1 IMPLANT
GLOVE BIO SURGEON STRL SZ 6.5 (GLOVE) ×1 IMPLANT
GLOVE BIOGEL PI IND STRL 7.5 (GLOVE) ×1 IMPLANT
GLOVE BIOGEL PI IND STRL 8 (GLOVE) ×1 IMPLANT
GLOVE BIOGEL PI INDICATOR 7.5 (GLOVE) ×1
GLOVE BIOGEL PI INDICATOR 8 (GLOVE) ×1
GLOVE ECLIPSE 8.0 STRL XLNG CF (GLOVE) ×2 IMPLANT
GLOVE INDICATOR 6.5 STRL GRN (GLOVE) ×1 IMPLANT
GLOVE ORTHO TXT STRL SZ7.5 (GLOVE) ×4 IMPLANT
GLOVE SURG SS PI 7.5 STRL IVOR (GLOVE) ×1 IMPLANT
GOWN BRE IMP PREV XXLGXLNG (GOWN DISPOSABLE) ×2 IMPLANT
GOWN PREVENTION PLUS XXLARGE (GOWN DISPOSABLE) ×2 IMPLANT
GOWN STRL NON-REIN LRG LVL3 (GOWN DISPOSABLE) ×4 IMPLANT
KIT BASIN OR (CUSTOM PROCEDURE TRAY) ×2 IMPLANT
PACK TOTAL JOINT (CUSTOM PROCEDURE TRAY) ×2 IMPLANT
PADDING CAST COTTON 6X4 STRL (CAST SUPPLIES) ×2 IMPLANT
RETRACTOR WND ALEXIS 18 MED (MISCELLANEOUS) IMPLANT
RTRCTR WOUND ALEXIS 18CM MED (MISCELLANEOUS) ×2
SPONGE GAUZE 2X2 STER 10/PKG (GAUZE/BANDAGES/DRESSINGS) ×1
SUCTION FRAZIER 12FR DISP (SUCTIONS) ×2 IMPLANT
SUT MNCRL AB 4-0 PS2 18 (SUTURE) ×2 IMPLANT
SUT VIC AB 1 CT1 36 (SUTURE) ×7 IMPLANT
SUT VIC AB 2-0 CT1 27 (SUTURE) ×4
SUT VIC AB 2-0 CT1 TAPERPNT 27 (SUTURE) ×2 IMPLANT
SUT VLOC 180 0 24IN GS25 (SUTURE) ×2 IMPLANT
TOWEL OR 17X26 10 PK STRL BLUE (TOWEL DISPOSABLE) ×4 IMPLANT
TRAY FOLEY CATH 14FRSI W/METER (CATHETERS) ×2 IMPLANT

## 2012-04-28 NOTE — Anesthesia Preprocedure Evaluation (Addendum)
Anesthesia Evaluation  Patient identified by MRN, date of birth, ID band Patient awake    Reviewed: Allergy & Precautions, H&P , NPO status , Patient's Chart, lab work & pertinent test results  Airway Mallampati: II TM Distance: >3 FB Neck ROM: full    Dental No notable dental hx.    Pulmonary neg pulmonary ROS,  breath sounds clear to auscultation  Pulmonary exam normal       Cardiovascular Exercise Tolerance: Good negative cardio ROS  Rhythm:regular Rate:Normal     Neuro/Psych negative neurological ROS  negative psych ROS   GI/Hepatic negative GI ROS, Neg liver ROS,   Endo/Other  negative endocrine ROS  Renal/GU negative Renal ROS  negative genitourinary   Musculoskeletal   Abdominal   Peds  Hematology negative hematology ROS (+)   Anesthesia Other Findings   Reproductive/Obstetrics negative OB ROS                           Anesthesia Physical Anesthesia Plan  ASA: I  Anesthesia Plan: General   Post-op Pain Management:    Induction: Intravenous  Airway Management Planned: Oral ETT  Additional Equipment:   Intra-op Plan:   Post-operative Plan: Extubation in OR  Informed Consent: I have reviewed the patients History and Physical, chart, labs and discussed the procedure including the risks, benefits and alternatives for the proposed anesthesia with the patient or authorized representative who has indicated his/her understanding and acceptance.   Dental Advisory Given  Plan Discussed with: CRNA and Surgeon  Anesthesia Plan Comments:         Anesthesia Quick Evaluation  

## 2012-04-28 NOTE — Transfer of Care (Signed)
Immediate Anesthesia Transfer of Care Note  Patient: Shelby Calhoun  Procedure(s) Performed: Procedure(s) (LRB) with comments: TOTAL HIP ARTHROPLASTY ANTERIOR APPROACH (Right)  Patient Location: PACU  Anesthesia Type:General  Level of Consciousness: awake and alert   Airway & Oxygen Therapy: Patient Spontanous Breathing and Patient connected to face mask oxygen  Post-op Assessment: Report given to PACU RN and Post -op Vital signs reviewed and stable  Post vital signs: Reviewed and stable  Complications: No apparent anesthesia complications

## 2012-04-28 NOTE — Op Note (Signed)
NAME:  Shelby Calhoun                ACCOUNT NO.: [id number]      MEDICAL RECORD NO.: [id number]      FACILITY:  [facility]      PHYSICIAN:  Durene Romans D  DATE OF BIRTH:  December 25, 1960     DATE OF PROCEDURE:  04/28/2012                                 OPERATIVE REPORT         PREOPERATIVE DIAGNOSIS: Right  hip osteoarthritis.      POSTOPERATIVE DIAGNOSIS:  Right hip osteoarthritis.      PROCEDURE:  Right total hip replacement through an anterior approach   utilizing DePuy THR system, component size 52mm pinnacle cup, a size 36+4 neutral   Altrex liner, a size 2 Hi Tri Lock stem with a 36+1.5 delta ceramic   ball.      SURGEON:  Madlyn Frankel. Charlann Boxer, M.D.      ASSISTANT:  Lanney Gins, PA      ANESTHESIA:  General.      SPECIMENS:  None.      COMPLICATIONS:  None.      BLOOD LOSS:  300 cc     DRAINS:  One Hemovac.      INDICATION OF THE PROCEDURE:  Shelby Calhoun is a 51 y.o. female who had   presented to office for evaluation of right hip pain.  Radiographs revealed   progressive degenerative changes with bone-on-bone   articulation to the  hip joint.  The patient had painful limited range of   motion significantly affecting their overall quality of life.  The patient was failing to    respond to conservative measures, and at this point was ready   to proceed with more definitive measures.  The patient has noted progressive   degenerative changes in his hip, progressive problems and dysfunction   with regarding the hip prior to surgery.  Consent was obtained for   benefit of pain relief.  Specific risk of infection, DVT, component   failure, dislocation, need for revision surgery, as well discussion of   the anterior versus posterior approach were reviewed.  Consent was   obtained for benefit of anterior pain relief through an anterior   approach.      PROCEDURE IN DETAIL:  The patient was brought to operative theater.   Once adequate anesthesia,  preoperative antibiotics, 2gm Ancef administered.   The patient was positioned supine on the OSI Hanna table.  Once adequate   padding of boney process was carried out, we had predraped out the hip, and  used fluoroscopy to confirm orientation of the pelvis and position.      The right hip was then prepped and draped from proximal iliac crest to   mid thigh with shower curtain technique.      Time-out was performed identifying the patient, planned procedure, and   extremity.     An incision was then made 2 cm distal and lateral to the   anterior superior iliac spine extending over the orientation of the   tensor fascia lata muscle and sharp dissection was carried down to the   fascia of the muscle and protractor placed in the soft tissues.      The fascia was then incised.  The muscle belly was identified and  swept   laterally and retractor placed along the superior neck.  Following   cauterization of the circumflex vessels and removing some pericapsular   fat, a second cobra retractor was placed on the inferior neck.  A third   retractor was placed on the anterior acetabulum after elevating the   anterior rectus.  A L-capsulotomy was along the line of the   superior neck to the trochanteric fossa, then extended proximally and   distally.  Tag sutures were placed and the retractors were then placed   intracapsular.  We then identified the trochanteric fossa and   orientation of my neck cut, confirmed this radiographically   and then made a neck osteotomy with the femur on traction.  The femoral   head was removed without difficulty or complication.  Traction was let   off and retractors were placed posterior and anterior around the   acetabulum.      The labrum and foveal tissue were debrided.  I began reaming with a 47mm   reamer and reamed up to 51mm reamer with good bony bed preparation and a 52   cup was chosen.  The final 52mm Pinnacle cup was then impacted under fluoroscopy  to  confirm the depth of penetration and orientation with respect to   abduction.  A screw was placed followed by the hole eliminator.  The final   36+4 neutral Altrex liner was impacted with good visualized rim fit.  The cup was positioned anatomically within the acetabular portion of the pelvis.      At this point, the femur was rolled at 80 degrees.  Further capsule was   released off the inferior aspect of the femoral neck.  I then   released the superior capsule proximally.  The hook was placed laterally   along the femur and elevated manually and held in position with the bed   hook.  The leg was then extended and adducted with the leg rolled to 100   degrees of external rotation.  Once the proximal femur was fully   exposed, I used a box osteotome to set orientation.  I then began   broaching with the starting chili pepper broach and passed this by hand and then broached up to 2.  With the 2 broach in place I chose a high offset neck and did a trial reduction with a 36+1.5 ball.  The offset was appropriate, leg lengths   appeared to be equal, confirmed radiographically.   Given these findings, I went ahead and dislocated the hip, repositioned all   retractors and positioned the right hip in the extended and abducted position.  The final 2 Hi Tri Lock stem was   chosen and it was impacted down to the level of neck cut.  Based on this   and the trial reduction, a 36+1.5 delta ceramic ball was chosen and   impacted onto a clean and dry trunnion, and the hip was reduced.  The   hip had been irrigated throughout the case again at this point.  I did   reapproximate the superior capsular leaflet to the anterior leaflet   using #1 Vicryl, placed a medium Hemovac drain deep.  The fascia of the   tensor fascia lata muscle was then reapproximated using #1 Vicryl.  The   remaining wound was closed with 2-0 Vicryl and running 4-0 Monocryl.   The hip was cleaned, dried, and dressed sterilely using  Dermabond and   Aquacel dressing.  Drain site dressed separately.  She was then brought   to recovery room in stable condition tolerating the procedure well.    Lanney Gins, PA-C was present for the entirety of the case involved from   preoperative positioning, perioperative retractor management, general   facilitation of the case, as well as primary wound closure as assistant.            Madlyn Frankel Charlann Boxer, M.D.            MDO/MEDQ  D:  04/09/2011  T:  04/09/2011  Job:  [id number]      Electronically Signed by Durene Romans M.D. on 04/15/2011 09:15:38 AM

## 2012-04-28 NOTE — Interval H&P Note (Signed)
History and Physical Interval Note:  04/28/2012 1:18 PM  Shelby Calhoun  has presented today for surgery, with the diagnosis of Osteoarthritis of the Right Hip  The various methods of treatment have been discussed with the patient and family. After consideration of risks, benefits and other options for treatment, the patient has consented to  Procedure(s) (LRB) with comments: TOTAL HIP ARTHROPLASTY ANTERIOR APPROACH (Right) as a surgical intervention .  The patient's history has been reviewed, patient examined, no change in status, stable for surgery.  I have reviewed the patient's chart and labs.  Questions were answered to the patient's satisfaction.     Shelda Pal

## 2012-04-28 NOTE — Anesthesia Postprocedure Evaluation (Signed)
  Anesthesia Post-op Note  Patient: Shelby Calhoun  Procedure(s) Performed: Procedure(s) (LRB): TOTAL HIP ARTHROPLASTY ANTERIOR APPROACH (Right)  Patient Location: PACU  Anesthesia Type: General  Level of Consciousness: awake and alert   Airway and Oxygen Therapy: Patient Spontanous Breathing  Post-op Pain: mild  Post-op Assessment: Post-op Vital signs reviewed, Patient's Cardiovascular Status Stable, Respiratory Function Stable, Patent Airway and No signs of Nausea or vomiting  Post-op Vital Signs: stable  Complications: No apparent anesthesia complications

## 2012-04-28 NOTE — Preoperative (Signed)
Beta Blockers   Reason not to administer Beta Blockers:Not Applicable 

## 2012-04-29 ENCOUNTER — Encounter (HOSPITAL_COMMUNITY): Payer: Self-pay | Admitting: Orthopedic Surgery

## 2012-04-29 DIAGNOSIS — D500 Iron deficiency anemia secondary to blood loss (chronic): Secondary | ICD-10-CM

## 2012-04-29 LAB — CBC
MCH: 31.9 pg (ref 26.0–34.0)
MCV: 92.2 fL (ref 78.0–100.0)
Platelets: 259 10*3/uL (ref 150–400)
RBC: 3.07 MIL/uL — ABNORMAL LOW (ref 3.87–5.11)
RDW: 11.3 % — ABNORMAL LOW (ref 11.5–15.5)

## 2012-04-29 LAB — BASIC METABOLIC PANEL
CO2: 23 mEq/L (ref 19–32)
Calcium: 7.7 mg/dL — ABNORMAL LOW (ref 8.4–10.5)
Creatinine, Ser: 0.61 mg/dL (ref 0.50–1.10)

## 2012-04-29 MED ORDER — METHOCARBAMOL 500 MG PO TABS: 500.0000 mg | ORAL_TABLET | Freq: Four times a day (QID) | ORAL | Status: DC | PRN

## 2012-04-29 MED ORDER — DSS 100 MG PO CAPS: 100.0000 mg | ORAL_CAPSULE | Freq: Two times a day (BID) | ORAL | Status: DC

## 2012-04-29 MED ORDER — ASPIRIN EC 325 MG PO TBEC: 325.0000 mg | DELAYED_RELEASE_TABLET | Freq: Two times a day (BID) | ORAL | Status: DC

## 2012-04-29 MED ORDER — POLYETHYLENE GLYCOL 3350 17 G PO PACK: 17.0000 g | PACK | Freq: Two times a day (BID) | ORAL | Status: DC

## 2012-04-29 MED ORDER — FERROUS SULFATE 325 (65 FE) MG PO TABS: 325.0000 mg | ORAL_TABLET | Freq: Three times a day (TID) | ORAL | Status: DC

## 2012-04-29 MED ORDER — DIPHENHYDRAMINE HCL 25 MG PO CAPS: 25.0000 mg | ORAL_CAPSULE | Freq: Four times a day (QID) | ORAL | Status: DC | PRN

## 2012-04-29 MED ORDER — TRAMADOL HCL 50 MG PO TABS: 50.0000 mg | ORAL_TABLET | Freq: Four times a day (QID) | ORAL | Status: DC | PRN

## 2012-04-29 MED ORDER — TRAMADOL HCL 50 MG PO TABS
50.0000 mg | ORAL_TABLET | Freq: Four times a day (QID) | ORAL | Status: DC | PRN
  Administered 2012-04-29 – 2012-04-30 (×2): 50 mg via ORAL
  Filled 2012-04-29: qty 1
  Filled 2012-04-29: qty 2

## 2012-04-29 NOTE — Progress Notes (Signed)
Utilization review completed.  

## 2012-04-29 NOTE — Evaluation (Signed)
Occupational Therapy Evaluation Patient Details Name: Shelby Calhoun MRN: [id number] DOB: 1961/05/21 Today's Date: 04/29/2012 Time: 4098-1191 OT Time Calculation (min): 25 min  OT Assessment / Plan / Recommendation Clinical Impression  This 51 year old female was admitted for R anterior direct THA. All education was completed and pt does not need any further OT.      OT Assessment  Patient does not need any further OT services    Follow Up Recommendations  No OT follow up    Barriers to Discharge      Equipment Recommendations  None recommended by OT (pt will try standard commode at home)    Recommendations for Other Services    Frequency       Precautions / Restrictions Precautions Precautions: None;Anterior Hip Restrictions Weight Bearing Restrictions: No RLE Weight Bearing: Weight bearing as tolerated   Pertinent Vitals/Pain Pain "not bad" in R hip.  Repositioned and ice applied    ADL  Grooming: Performed;Set up Where Assessed - Grooming: Supported sitting Upper Body Bathing: Performed;Set up Where Assessed - Upper Body Bathing: Unsupported sitting Lower Body Bathing: Performed;Minimal assistance Where Assessed - Lower Body Bathing: Supported sit to stand Upper Body Dressing: Performed;Minimal assistance (iv) Where Assessed - Upper Body Dressing: Supported sit to stand Lower Body Dressing: Performed;Moderate assistance (pants underwear socks) Where Assessed - Lower Body Dressing: Supported sit to stand Toilet Transfer: Performed;Min Pension scheme manager Method: Sit to Barista: Comfort height toilet Toileting - Clothing Manipulation and Hygiene: Performed;Min guard Where Assessed - Engineer, mining and Hygiene: Sit to stand from 3-in-1 or toilet Transfers/Ambulation Related to ADLs: ambulated to bathroom.  Demonstrated shower transfer but pt did not feel she needed to practice. ADL Comments: Will have help for a little over  a week.  Educated on use of reacher for adls.  She wants to try her standard commode at home:  she'll let HH know if 3:1 is needed.  Sat on comfort height today    OT Diagnosis:    OT Problem List:   OT Treatment Interventions:     OT Goals    Visit Information  Last OT Received On: 04/29/12 Assistance Needed: +1    Subjective Data  Subjective: The vicodin made me a little loopy, just so you know Patient Stated Goal: home with help   Prior Functioning     Home Living Lives With: Spouse Available Help at Discharge: Family Bathroom Shower/Tub: Walk-in Stage manager: Standard Home Adaptive Equipment: Environmental consultant - rolling (borrowed) Additional Comments: sister will be in to help next week Prior Function Level of Independence: Independent Communication Communication: No difficulties         Vision/Perception     Cognition  Overall Cognitive Status: Appears within functional limits for tasks assessed/performed Arousal/Alertness: Awake/alert Orientation Level: Oriented X4 / Intact Behavior During Session: [facility] for tasks performed    Extremity/Trunk Assessment Right Upper Extremity Assessment RUE ROM/Strength/Tone: [facility] for tasks assessed Left Upper Extremity Assessment LUE ROM/Strength/Tone: WFL for tasks assessed     Mobility Bed Mobility Bed Mobility: Supine to Sit Supine to Sit: 6: Modified independent (Device/Increase time) (extra time) Transfers Transfers: Sit to Stand Sit to Stand: 4: Min guard (felt a little funny from meds but no unsteadiness)     Shoulder Instructions     Exercise     Balance     End of Session OT - End of Session Activity Tolerance: Patient tolerated treatment well Patient left: in chair;with call bell/phone  within reach Nurse Communication: Mobility status;Other (comment) (voided)  GO     Naziya Hegwood 04/29/2012, 9:27 AM Marica Otter, OTR/L [phone] 04/29/2012

## 2012-04-29 NOTE — Progress Notes (Signed)
   Subjective: 1 Day Post-Op Procedure(s) (LRB): TOTAL HIP ARTHROPLASTY ANTERIOR APPROACH (Right)   Patient reports pain as mild, pain well controlled. However the Norco does make her feel weird and knocks her out. She would like to try something less strong. No events throughout the night.   Objective:   VITALS:   Filed Vitals:   04/29/12 0800  BP: 101/64  Pulse: 60  Temp: 98.1 F (36.7 C)   Resp: 15    Neurovascular intact Dorsiflexion/Plantar flexion intact Incision: dressing C/D/I No cellulitis present Compartment soft  LABS  Basename 04/29/12 0443  HGB 9.8*  HCT 28.3*  WBC 9.2  PLT 259     Basename 04/29/12 0443  NA 136  K 3.9  BUN 6  CREATININE 0.61  GLUCOSE 156*     Assessment/Plan: 1 Day Post-Op Procedure(s) (LRB): TOTAL HIP ARTHROPLASTY ANTERIOR APPROACH (Right) HV drain d/c'ed Foley cath d/c'ed Changed from Norco to tramadol Advance diet Up with therapy D/C IV fluids Discharge home with home health, after PT if she feel good Follow up in 2 weeks at [facility].  Follow up with OLIN,Gayleen Sholtz D in 2 weeks.   Contact information:   [facility] [address], [address] [place] [address] [phone]          Expected ABLA  Treated with iron and will observe      Anastasio Auerbach. Marica Trentham   PAC  04/29/2012, 8:14 AM

## 2012-04-29 NOTE — Evaluation (Signed)
Physical Therapy Evaluation Patient Details Name: Shelby Calhoun MRN: [id number] DOB: 12/06/1960 Today's Date: 04/29/2012 Time: 1050-1107 PT Time Calculation (min): 17 min  PT Assessment / Plan / Recommendation Clinical Impression  51 yo female s/p R THA-direct anterior. Mobilizing well. Recommend HHPT to increase strength, improve activity tolerance, and maximize independence/safety with functional mobility.     PT Assessment  Patient needs continued PT services    Follow Up Recommendations  Home health PT    Does the patient have the potential to tolerate intense rehabilitation      Barriers to Discharge        Equipment Recommendations  None recommended by PT    Recommendations for Other Services OT consult   Frequency 7X/week    Precautions / Restrictions Precautions Precautions: None Restrictions Weight Bearing Restrictions: No RLE Weight Bearing: Weight bearing as tolerated   Pertinent Vitals/Pain 2-3/10 R LE with activity      Mobility  Bed Mobility Bed Mobility: Sit to Supine Sit to Supine: 4: Min guard Transfers Transfers: Sit to Stand;Stand to Sit Sit to Stand: 5: Supervision;From chair/3-in-1 Stand to Sit: 5: Supervision;To chair/3-in-1 Details for Transfer Assistance: VCs safety, hand placement.  Ambulation/Gait Ambulation/Gait Assistance: 4: Min guard Ambulation Distance (Feet): 125 Feet Assistive device: Rolling walker Ambulation/Gait Assistance Details: Encouraged pt to increase WBing on R LE during ambulation. Slow gait speed.  Gait Pattern: Step-to pattern;Decreased stride length    Shoulder Instructions     Exercises     PT Diagnosis: Difficulty walking;Abnormality of gait;Acute pain  PT Problem List: Decreased strength;Decreased range of motion;Decreased activity tolerance;Decreased mobility;Pain;Decreased knowledge of use of DME PT Treatment Interventions: DME instruction;Gait training;Stair training;Functional mobility  training;Therapeutic activities;Therapeutic exercise;Patient/family education   PT Goals Acute Rehab PT Goals PT Goal Formulation: With patient Time For Goal Achievement: 05/06/12 Potential to Achieve Goals: Good Pt will go Supine/Side to Sit: with modified independence PT Goal: Supine/Side to Sit - Progress: Goal set today Pt will go Sit to Stand: with modified independence PT Goal: Sit to Stand - Progress: Goal set today Pt will Ambulate: >150 feet;with modified independence;with least restrictive assistive device PT Goal: Ambulate - Progress: Goal set today Pt will Go Up / Down Stairs: 1-2 stairs;with supervision;with least restrictive assistive device PT Goal: Up/Down Stairs - Progress: Goal set today  Visit Information  Last PT Received On: 04/29/12 Assistance Needed: +1    Subjective Data  Subjective: "I think I just wanna lie down for a cat nap before lunch" Patient Stated Goal: Home tomorrow   Prior Functioning  Home Living Lives With: Spouse Available Help at Discharge: Family Type of Home: House Home Access: Stairs to enter Secretary/administrator of Steps: 2 Entrance Stairs-Rails: None Home Layout: Two level;Able to live on main level with bedroom/bathroom Bathroom Shower/Tub: Health visitor: Standard Home Adaptive Equipment: Walker - rolling (borrowed) Additional Comments: sister will be in to help next week Prior Function Level of Independence: Independent Able to Take Stairs?: Yes Communication Communication: No difficulties    Cognition  Overall Cognitive Status: Appears within functional limits for tasks assessed/performed Arousal/Alertness: Awake/alert Orientation Level: Appears intact for tasks assessed Behavior During Session: [facility] for tasks performed    Extremity/Trunk Assessment Right Upper Extremity Assessment RUE ROM/Strength/Tone: [facility] for tasks assessed Left Upper Extremity Assessment LUE ROM/Strength/Tone: WFL for tasks  assessed Right Lower Extremity Assessment RLE ROM/Strength/Tone: Deficits RLE ROM/Strength/Tone Deficits: Hip flex 2/5, hip abd/add2/5. knee ext 3/5. moves ankle well.  Left Lower Extremity Assessment LLE  ROM/Strength/Tone: [facility] for tasks assessed Trunk Assessment Trunk Assessment: Normal   Balance    End of Session PT - End of Session Activity Tolerance: Patient tolerated treatment well Patient left: in bed;with call bell/phone within reach  GP     Rebeca Alert [facility] 04/29/2012, 11:16 AM [phone]

## 2012-04-29 NOTE — Progress Notes (Signed)
Physical Therapy Treatment Patient Details Name: Shelby Calhoun MRN: [id number] DOB: Feb 23, 1961 Today's Date: 04/29/2012 Time: 8469-6295 PT Time Calculation (min): 28 min  PT Assessment / Plan / Recommendation Comments on Treatment Session  POD #1 pm session.  Pt given handout on Direct Anterior THR and performed all.  Instructed on reps and freq.  Instructed on use of ICE.  Instructed on side lying sleeping positioning. Amb in hallway with AD. Pt plans to D/C to home tomorrow.    Follow Up Recommendations  Home health PT     Does the patient have the potential to tolerate intense rehabilitation     Barriers to Discharge        Equipment Recommendations  None recommended by PT (already has a RW)    Recommendations for Other Services    Frequency 7X/week   Plan Discharge plan remains appropriate    Precautions / Restrictions Precautions Precautions: None Precaution Comments: Direct Anterior THR Restrictions Weight Bearing Restrictions: No RLE Weight Bearing: Weight bearing as tolerated    Pertinent Vitals/Pain C/o "soreness' ICE applied    Mobility  Bed Mobility Bed Mobility: Supine to Sit;Sit to Supine Supine to Sit: 5: Supervision Sit to Supine: 5: Supervision Details for Bed Mobility Assistance: only requires increased time  Transfers Transfers: Sit to Stand;Stand to Sit Sit to Stand: 5: Supervision;From bed Stand to Sit: 5: Supervision;To chair/3-in-1 Details for Transfer Assistance: increased time, good use of hands  Ambulation/Gait Ambulation/Gait Assistance: 5: Supervision;4: Min guard Ambulation Distance (Feet): 198 Feet Assistive device: Rolling walker Ambulation/Gait Assistance Details: pt more able to WB thru R LE almost equally and tolerate alternating gait. Slow gait speed. Gait Pattern: Step-through pattern;Decreased stride length Gait velocity: decreased    Exercises Total Joint Exercises Ankle Circles/Pumps: AROM;Both;10 reps;Supine Quad  Sets: AROM;Both;10 reps;Supine Gluteal Sets: AROM;Both;10 reps;Supine Short Arc Quad: AROM;Right;10 reps;Supine Heel Slides: AROM;Right;10 reps;Supine Hip ABduction/ADduction: AROM;Right;10 reps;Supine Long Arc Quad: AROM;Right;10 reps;Supine Knee Flexion: AROM;Right;10 reps;Supine Marching in Standing: AROM;Right;10 reps;Supine Standing Hip Extension: AROM;Right;10 reps;Supine    PT Goals                                                              progressing    Visit Information  Last PT Received On: 04/29/12 Assistance Needed: +1    Subjective Data  Subjective: I just feel sore Patient Stated Goal: home tomorrow   Cognition       Balance     End of Session PT - End of Session Equipment Utilized During Treatment: Gait belt Activity Tolerance: Patient tolerated treatment well Patient left: in bed;with call bell/phone within reach (ICE to R hip)   Felecia Shelling  PTA [facility]  Acute  Rehab Pager     [phone]

## 2012-04-30 LAB — CBC
Hemoglobin: 9.5 g/dL — ABNORMAL LOW (ref 12.0–15.0)
MCH: 31.7 pg (ref 26.0–34.0)
Platelets: 237 10*3/uL (ref 150–400)
RBC: 3 MIL/uL — ABNORMAL LOW (ref 3.87–5.11)
WBC: 11 10*3/uL — ABNORMAL HIGH (ref 4.0–10.5)

## 2012-04-30 LAB — BASIC METABOLIC PANEL
CO2: 24 mEq/L (ref 19–32)
Calcium: 8.4 mg/dL (ref 8.4–10.5)
Potassium: 3.6 mEq/L (ref 3.5–5.1)
Sodium: 137 mEq/L (ref 135–145)

## 2012-04-30 MED ORDER — HYDROCODONE-ACETAMINOPHEN 5-325 MG PO TABS: 1.0000 | ORAL_TABLET | ORAL | Status: DC | PRN

## 2012-04-30 NOTE — Discharge Summary (Signed)
Physician Discharge Summary  Patient ID: Shelby Calhoun MRN: [id number] DOB/AGE: July 03, 1960 51 y.o.  Admit date: 04/28/2012 Discharge date:  04/30/2012  Procedures:  Procedure(s) (LRB): TOTAL HIP ARTHROPLASTY ANTERIOR APPROACH (Right)  Attending Physician:  Dr. Durene Romans   Admission Diagnoses:   Right hip OA / pain  Discharge Diagnoses:  Principal Problem:  *S/P right THA, AA Active Problems:  Expected blood loss anemia Hyperlipidemia  HPI: Shelby Calhoun, 51 y.o. female, has a history of pain and functional disability in the right hip(s) due to arthritis and patient has failed non-surgical conservative treatments for greater than 12 weeks to include NSAID's and/or analgesics, corticosteriod injections and activity modification. Onset of symptoms was gradual starting 2 years ago with rapidlly worsening course since that time.The patient noted no past surgery on the right hip(s). Patient currently rates pain in the right hip at 7 out of 10 with activity. Patient has worsening of pain with activity and weight bearing, pain that interfers with activities of daily living and pain with passive range of motion. Patient has evidence of periarticular osteophytes and joint space narrowing by imaging studies. This condition presents safety issues increasing the risk of falls. Risks, benefits and expectations were discussed with the patient. Patient understand the risks, benefits and expectations and wishes to proceed with surgery.   PCP: Shelby Melnick, MD   Discharged Condition: good  Hospital Course:  Patient underwent the above stated procedure on 04/28/2012. Patient tolerated the procedure well and brought to the recovery room in good condition and subsequently to the floor.  POD #1 BP: 101/64 ; Pulse: 60 ; Temp: 98.1 F (36.7 C) ; Resp: 15  Pt's foley was removed, as well as the hemovac drain removed. IV was changed to a saline lock. Patient reports pain as mild, pain well  controlled. However the Norco does make her feel weird and knocks her out. She would like to try something less strong. No events throughout the night. Neurovascular intact, dorsiflexion/plantar flexion intact, incision: dressing C/D/I, no cellulitis present and compartment soft.   LABS  Basename  04/29/12 0443   HGB  9.8  HCT  28.3   POD #2  BP: 106/70 ; Pulse: 66 ; Temp: 99.5 F (37.5 C) ; Resp: 14  Patient reports pain as mild, pain well controlled. No events throughout the night. Ready to be discharged home. Neurovascular intact, dorsiflexion/plantar flexion intact, incision: dressing C/D/I, no cellulitis present and compartment soft.   LABS  Basename  04/30/12 0421   HGB  9.5  HCT  27.8    Discharge Exam: General appearance: alert, cooperative and no distress Extremities: Homans sign is negative, no sign of DVT, no edema, redness or tenderness in the calves or thighs and no ulcers, gangrene or trophic changes  Disposition: Home  with follow up in 2 weeks   Follow-up Information    Follow up with Shelby Pal, MD. In 2 weeks.   Contact information:   [address] [place] [address] [phone]          Discharge Orders    Future Orders Please Complete By Expires   Diet - low sodium heart healthy      Call MD / Call 911      Comments:   If you experience chest pain or shortness of breath, CALL 911 and be transported to the hospital emergency room.  If you develope a fever above 101 F, pus (white drainage) or increased drainage or redness  at the wound, or calf pain, call your surgeon's office.   Discharge instructions      Comments:   Maintain surgical dressing for 10-14 days, then replace with gauze and tape. Keep the area dry and clean until follow up. Follow up in 2 weeks at [facility]. Call with any questions or concerns.   Constipation Prevention      Comments:   Drink plenty of fluids.  Prune juice may be helpful.  You may use a  stool softener, such as Colace (over the counter) 100 mg twice a day.  Use MiraLax (over the counter) for constipation as needed.   Increase activity slowly as tolerated      Change dressing      Comments:   Maintain surgical dressing for 8 days, then replace with 4x4 guaze and tape. Keep the area dry and clean.   TED hose      Comments:   Use stockings (TED hose) for 2 weeks on both leg(s).  You may remove them at night for sleeping.      Current Discharge Medication List    START taking these medications   Details  aspirin EC 325 MG tablet Take 1 tablet (325 mg total) by mouth 2 (two) times daily. X 4 weeks Qty: 60 tablet, Refills: 0    diphenhydrAMINE (BENADRYL) 25 mg capsule Take 1 capsule (25 mg total) by mouth every 6 (six) hours as needed for itching, allergies or sleep. Qty: 30 capsule    docusate sodium 100 MG CAPS Take 100 mg by mouth 2 (two) times daily. Qty: 10 capsule    HYDROcodone-acetaminophen (NORCO) 5-325 MG per tablet Take 1 tablet by mouth every 4 (four) hours as needed for pain. Qty: 30 tablet, Refills: 0    methocarbamol (ROBAXIN) 500 MG tablet Take 1 tablet (500 mg total) by mouth every 6 (six) hours as needed (muscle spasms).    polyethylene glycol (MIRALAX / GLYCOLAX) packet Take 17 g by mouth 2 (two) times daily. Qty: 14 each    traMADol (ULTRAM) 50 MG tablet Take 1-2 tablets (50-100 mg total) by mouth every 6 (six) hours as needed for pain. Qty: 100 tablet, Refills: 0      CONTINUE these medications which have CHANGED   Details  ferrous sulfate 325 (65 FE) MG tablet Take 1 tablet (325 mg total) by mouth 3 (three) times daily after meals.      CONTINUE these medications which have NOT CHANGED   Details  calcium-vitamin D (OSCAL WITH D) 500-200 MG-UNIT per tablet Take 1 tablet by mouth daily.    co-enzyme Q-10 30 MG capsule Take 30 mg by mouth daily.    Glucosamine-Chondroit-Vit C-Mn (GLUCOSAMINE CHONDR 1500 COMPLX PO) Take 1-2 tablets by mouth  daily.     Multiple Vitamin (MULTIVITAMIN) tablet Take 1 tablet by mouth daily.    norgestrel-ethinyl estradiol (LO/OVRAL,CRYSELLE) 0.3-30 MG-MCG tablet Take 1 tablet by mouth daily.      STOP taking these medications     ibuprofen (ADVIL,MOTRIN) 200 MG tablet Comments:  Reason for Stopping:           Signed: Anastasio Auerbach. Othel Hoogendoorn   PAC  04/30/2012, 8:34 AM

## 2012-04-30 NOTE — Care Management Note (Signed)
    Page 1 of 2   04/30/2012     6:09:22 PM   CARE MANAGEMENT NOTE 04/30/2012  Patient:  Shelby Calhoun, Shelby Calhoun   Account Number:  [id number]  Date Initiated:  04/29/2012  Documentation initiated by:  Colleen Can  Subjective/Objective Assessment:   dx anterior hip arthroplasty-right     Action/Plan:   Cm spoke with patient. Plans to go home with spouse as caregiver. Already has RW. Wants [facility] for [facility] services   Anticipated DC Date:  04/30/2012   Anticipated DC Plan:  HOME W HOME HEALTH SERVICES  In-house referral  NA      DC Planning Services  CM consult      [facility] Choice  HOME HEALTH   Choice offered to / List presented to:  C-1 Patient   DME arranged  NA      DME agency  NA     HH arranged  HH-2 PT      HH agency  [facility]   Status of service:  Completed, signed off Medicare Important Message given?  NO (If response is "NO", the following Medicare IM given date fields will be blank) Date Medicare IM given:   Date Additional Medicare IM given:    Discharge Disposition:  HOME W HOME HEALTH SERVICES  Per UR Regulation:  Reviewed for med. necessity/level of care/duration of stay  If discussed at Long Length of Stay Meetings, dates discussed:    Comments:  04/30/2012 Colleen Can BSN RN CCM [phone] PT DISCHARGED TODAY WITH HH SERVICES IN PLACE. SERVICES WILL START TOMORROW 05/01/2012.

## 2012-04-30 NOTE — Progress Notes (Signed)
Physical Therapy Treatment Patient Details Name: Shelby Calhoun MRN: [id number] DOB: November 28, 1960 Today's Date: 04/30/2012 Time: 4696-2952 PT Time Calculation (min): 24 min  PT Assessment / Plan / Recommendation Comments on Treatment Session  Completed all education. Practiced steps and exercises as well. Ready to d/c home. HHPT    Follow Up Recommendations  Home health PT     Does the patient have the potential to tolerate intense rehabilitation     Barriers to Discharge        Equipment Recommendations  None recommended by PT    Recommendations for Other Services    Frequency 7X/week   Plan Discharge plan remains appropriate    Precautions / Restrictions Precautions Precautions: None Restrictions Weight Bearing Restrictions: No RLE Weight Bearing: Weight bearing as tolerated   Pertinent Vitals/Pain "sore"-R hip    Mobility  Bed Mobility Bed Mobility: Not assessed Details for Bed Mobility Assistance: Pt sitting EOB Transfers Transfers: Sit to Stand;Stand to Sit Sit to Stand: 6: Modified independent (Device/Increase time);From bed;From chair/3-in-1 Stand to Sit: 6: Modified independent (Device/Increase time);To chair/3-in-1 Ambulation/Gait Ambulation/Gait Assistance: 5: Supervision Ambulation Distance (Feet): 200 Feet Assistive device: Rolling walker Ambulation/Gait Assistance Details: Slow gait speed.  Gait Pattern: Antalgic;Step-through pattern;Decreased stride length Stairs: Yes Stairs Assistance: 4: Min assist Stairs Assistance Details (indicate cue type and reason): Vcs safety, sequence. Discussed stair negotiation with walker (backwards)-pt declined to use this technique. Pt preferred to have therapist give supporting hand for pt to hold onto to allow step up. Pt states husband will be able to provide assistance as well.  Stair Management Technique: Forwards;No rails Number of Stairs: 2     Exercises Total Joint Exercises Ankle Circles/Pumps:  AROM;Both;Seated;10 reps Quad Sets: AROM;Both;10 reps;Seated Short Arc Quad: AROM;Right;10 reps;Seated Heel Slides: AROM;Right;10 reps;Seated Hip ABduction/ADduction: Right;10 reps;AROM;Seated;Standing Long Arc Quad: AROM;Right;10 reps;Seated Knee Flexion: AROM;Right;10 reps;Standing Marching in Standing: AROM;Right;10 reps;Standing   PT Diagnosis:    PT Problem List:   PT Treatment Interventions:     PT Goals Acute Rehab PT Goals Pt will go Sit to Stand: with modified independence PT Goal: Sit to Stand - Progress: Met Pt will Ambulate: >150 feet;with supervision;with least restrictive assistive device PT Goal: Ambulate - Progress: Progressing toward goal Pt will Go Up / Down Stairs: 1-2 stairs;with supervision;with least restrictive assistive device PT Goal: Up/Down Stairs - Progress: Progressing toward goal  Visit Information  Last PT Received On: 04/30/12 Assistance Needed: +1    Subjective Data  Subjective: "It's just tender/sore" Patient Stated Goal: HOme   Cognition  Overall Cognitive Status: Appears within functional limits for tasks assessed/performed Arousal/Alertness: Awake/alert Orientation Level: Appears intact for tasks assessed Behavior During Session: [facility] for tasks performed    Balance     End of Session PT - End of Session Activity Tolerance: Patient tolerated treatment well Patient left: in chair;with call bell/phone within reach;with family/visitor present   GP     Rebeca Alert [facility] 04/30/2012, 9:08 AM [phone]

## 2012-04-30 NOTE — Progress Notes (Signed)
   Subjective: 2 Days Post-Op Procedure(s) (LRB): TOTAL HIP ARTHROPLASTY ANTERIOR APPROACH (Right)   Patient reports pain as mild, pain well controlled. No events throughout the night. Ready to be discharged home.  Objective:   VITALS:   Filed Vitals:   04/30/12 0603  BP: 106/70  Pulse: 66  Temp: 99.5 F (37.5 C)  Resp: 14    Neurovascular intact Dorsiflexion/Plantar flexion intact Incision: dressing C/D/I No cellulitis present Compartment soft  LABS  Basename 04/30/12 0421 04/29/12 0443  HGB 9.5* 9.8*  HCT 27.8* 28.3*  WBC 11.0* 9.2  PLT 237 259     Basename 04/30/12 0421 04/29/12 0443  NA 137 136  K 3.6 3.9  BUN 5* 6  CREATININE 0.56 0.61  GLUCOSE 129* 156*     Assessment/Plan: 2 Days Post-Op Procedure(s) (LRB): TOTAL HIP ARTHROPLASTY ANTERIOR APPROACH (Right) Up with therapy Discharge home with home health Follow up in 2 weeks at [facility]. Follow up with OLIN,Kelven Flater D in 2 weeks.  Contact information:  [facility] [address] [place] [address] [phone]    Expected ABLA  Treated with iron and will observe    Anastasio Auerbach. Cleveland Yarbro   PAC  04/30/2012, 8:30 AM

## 2012-08-01 ENCOUNTER — Other Ambulatory Visit: Payer: Self-pay

## 2013-04-17 LAB — HM PAP SMEAR

## 2013-04-22 ENCOUNTER — Other Ambulatory Visit: Payer: Self-pay

## 2013-05-12 ENCOUNTER — Encounter: Payer: Self-pay | Admitting: Internal Medicine

## 2013-07-02 ENCOUNTER — Ambulatory Visit (INDEPENDENT_AMBULATORY_CARE_PROVIDER_SITE_OTHER): Payer: 59 | Admitting: Internal Medicine

## 2013-07-02 ENCOUNTER — Encounter: Payer: Self-pay | Admitting: Internal Medicine

## 2013-07-02 VITALS — BP 108/70 | HR 67 | Temp 98.3°F | Resp 11 | Ht 64.5 in | Wt 152.2 lb

## 2013-07-02 DIAGNOSIS — Z0000 Encounter for general adult medical examination without abnormal findings: Secondary | ICD-10-CM

## 2013-07-02 DIAGNOSIS — E785 Hyperlipidemia, unspecified: Secondary | ICD-10-CM

## 2013-07-02 LAB — POCT URINALYSIS DIPSTICK
Bilirubin, UA: NEGATIVE
GLUCOSE UA: NEGATIVE
KETONES UA: NEGATIVE
Leukocytes, UA: NEGATIVE
Nitrite, UA: NEGATIVE
Protein, UA: NEGATIVE
RBC UA: NEGATIVE
UROBILINOGEN UA: 0.2
pH, UA: 5

## 2013-07-02 NOTE — Progress Notes (Signed)
   Subjective:    Patient ID: Shelby Calhoun, female    DOB: 1960/07/03, 53 y.o.   MRN: [id number]  HPI She is here for a physical;acute issues include possible UTI.     Review of Systems  In the last 2 weeks she's had self-limited dysuria and malodorous urine. She also has had frequency of urination but she has increased her fluid intake. She is not having fever, chills, sweats, or flank pain. She also denies pyuria or hematuria.    Objective:   Physical Exam Gen.: Thin but healthy and well-nourished in appearance. Alert, appropriate and cooperative throughout exam.Appears younger than stated age  Head: Normocephalic without obvious abnormalities  Eyes: No corneal or conjunctival inflammation noted. Pupils equal round reactive to light and accommodation. Extraocular motion intact.  Ears: External  ear exam reveals no significant lesions or deformities. Canals clear .TMs normal. Hearing is grossly normal bilaterally. Nose: External nasal exam reveals no deformity or inflammation. Nasal mucosa are pink and moist. No lesions or exudates noted.   Mouth: Oral mucosa and oropharynx reveal no lesions or exudates. Teeth in good repair. Neck: No deformities, masses, or tenderness noted. Range of motion &  Thyroid normal. Lungs: Normal respiratory effort; chest expands symmetrically. Lungs are clear to auscultation without rales, wheezes, or increased work of breathing. Heart: Normal rate and rhythm. Normal S1 and S2. No gallop, click, or rub. Intermittent S4 w/o murmur. Abdomen: Bowel sounds normal; abdomen soft and nontender. No masses, organomegaly or hernias noted. Aorta palpable ; no AAA Genitalia:  as per Gyn                                  Musculoskeletal/extremities: No deformity or scoliosis noted of  the thoracic or lumbar spine.  No clubbing, cyanosis, edema, or significant extremity  deformity noted. Range of motion normal .Tone & strength normal. 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 Fingernail / toenail health good. Able to lie down & sit up w/o help. Negative SLR bilaterally Vascular: Carotid, radial artery, dorsalis pedis and  posterior tibial pulses are full and equal. No bruits present. Neurologic: Alert and oriented x3. Deep tendon reflexes symmetrical and normal.        Skin: Intact without suspicious lesions or rashes. Lymph: No cervical, axillary lymphadenopathy present. Psych: Mood and affect are normal. Normally interactive                                                                                        Assessment & Plan:  #1 comprehensive physical exam; no acute findings #2 ? UTI  Plan: see Orders  & Recommendations

## 2013-07-02 NOTE — Patient Instructions (Signed)
Your next office appointment will be determined based upon review of your pending labs. Those instructions will be transmitted to you through My Chart . 

## 2013-07-02 NOTE — Progress Notes (Signed)
Pre visit review using our clinic review tool, if applicable. No additional management support is needed unless otherwise documented below in the visit note. 

## 2013-07-06 ENCOUNTER — Other Ambulatory Visit (INDEPENDENT_AMBULATORY_CARE_PROVIDER_SITE_OTHER): Payer: 59

## 2013-07-06 DIAGNOSIS — Z0000 Encounter for general adult medical examination without abnormal findings: Secondary | ICD-10-CM

## 2013-07-06 LAB — CBC WITH DIFFERENTIAL/PLATELET
BASOS ABS: 0 10*3/uL (ref 0.0–0.1)
Basophils Relative: 0.7 % (ref 0.0–3.0)
EOS ABS: 0.2 10*3/uL (ref 0.0–0.7)
Eosinophils Relative: 5.1 % — ABNORMAL HIGH (ref 0.0–5.0)
HEMATOCRIT: 39 % (ref 36.0–46.0)
HEMOGLOBIN: 13.2 g/dL (ref 12.0–15.0)
LYMPHS ABS: 0.9 10*3/uL (ref 0.7–4.0)
Lymphocytes Relative: 22.1 % (ref 12.0–46.0)
MCHC: 33.8 g/dL (ref 30.0–36.0)
MCV: 91.7 fl (ref 78.0–100.0)
MONO ABS: 0.3 10*3/uL (ref 0.1–1.0)
Monocytes Relative: 7.4 % (ref 3.0–12.0)
NEUTROS ABS: 2.7 10*3/uL (ref 1.4–7.7)
Neutrophils Relative %: 64.7 % (ref 43.0–77.0)
PLATELETS: 287 10*3/uL (ref 150.0–400.0)
RBC: 4.25 Mil/uL (ref 3.87–5.11)
RDW: 12.4 % (ref 11.5–14.6)
WBC: 4.2 10*3/uL — ABNORMAL LOW (ref 4.5–10.5)

## 2013-07-06 LAB — BASIC METABOLIC PANEL
BUN: 14 mg/dL (ref 6–23)
CHLORIDE: 105 meq/L (ref 96–112)
CO2: 27 meq/L (ref 19–32)
Calcium: 9.1 mg/dL (ref 8.4–10.5)
Creatinine, Ser: 0.7 mg/dL (ref 0.4–1.2)
GFR: 88.94 mL/min (ref >60.00)
GLUCOSE: 90 mg/dL (ref 70–99)
POTASSIUM: 3.8 meq/L (ref 3.5–5.1)
Sodium: 139 mEq/L (ref 135–145)

## 2013-07-06 LAB — LIPID PANEL
CHOLESTEROL: 199 mg/dL (ref 0–200)
HDL: 66.7 mg/dL (ref >39.00)
LDL CALC: 120 mg/dL — AB (ref 0–99)
TRIGLYCERIDES: 61 mg/dL (ref 0.0–149.0)
Total CHOL/HDL Ratio: 3
VLDL: 12.2 mg/dL (ref 0.0–40.0)

## 2013-07-06 LAB — HEPATIC FUNCTION PANEL
ALBUMIN: 3.9 g/dL (ref 3.5–5.2)
ALT: 22 U/L (ref 0–35)
AST: 21 U/L (ref 0–37)
Alkaline Phosphatase: 100 U/L (ref 39–117)
BILIRUBIN TOTAL: 0.5 mg/dL (ref 0.3–1.2)
Bilirubin, Direct: 0 mg/dL (ref 0.0–0.3)
Total Protein: 7 g/dL (ref 6.0–8.3)

## 2013-07-06 LAB — TSH: TSH: 2.49 u[IU]/mL (ref 0.35–5.50)

## 2013-11-25 ENCOUNTER — Ambulatory Visit: Payer: PRIVATE HEALTH INSURANCE

## 2013-11-25 ENCOUNTER — Other Ambulatory Visit: Payer: Self-pay | Admitting: Occupational Medicine

## 2013-11-25 DIAGNOSIS — M25512 Pain in left shoulder: Secondary | ICD-10-CM

## 2013-12-01 ENCOUNTER — Telehealth: Payer: Self-pay | Admitting: *Deleted

## 2013-12-01 NOTE — Telephone Encounter (Signed)
Had bunion surgery April 1st three years ago.  I need to have a MRI on my shoulder.  I want to make sure I don't have any metal or rod in my foot.  Am I able to have the MRI?  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  She asked if she would be able to have the MRI.  I told her she would have to ask the radiologist.  She asked if she could get a copy of the surgical report.  I told her we are a part of [place] now and our old records are in storage, not on site.  She stated oh okay.

## 2013-12-02 ENCOUNTER — Other Ambulatory Visit (HOSPITAL_COMMUNITY): Payer: Self-pay | Admitting: Family Medicine

## 2013-12-02 ENCOUNTER — Ambulatory Visit (HOSPITAL_COMMUNITY)
Admission: RE | Admit: 2013-12-02 | Discharge: 2013-12-02 | Disposition: A | Discharge status: Home / Self Care | Payer: PRIVATE HEALTH INSURANCE | Source: Ambulatory Visit | Attending: Family Medicine | Admitting: Family Medicine

## 2013-12-02 DIAGNOSIS — M25512 Pain in left shoulder: Secondary | ICD-10-CM

## 2013-12-02 DIAGNOSIS — W19XXXA Unspecified fall, initial encounter: Secondary | ICD-10-CM | POA: Insufficient documentation

## 2013-12-02 DIAGNOSIS — S42009A Fracture of unspecified part of unspecified clavicle, initial encounter for closed fracture: Secondary | ICD-10-CM | POA: Insufficient documentation

## 2013-12-02 DIAGNOSIS — M25519 Pain in unspecified shoulder: Secondary | ICD-10-CM | POA: Insufficient documentation

## 2013-12-07 ENCOUNTER — Ambulatory Visit (HOSPITAL_COMMUNITY): Payer: PRIVATE HEALTH INSURANCE

## 2014-01-25 ENCOUNTER — Ambulatory Visit: Payer: PRIVATE HEALTH INSURANCE | Attending: Specialist

## 2014-01-25 ENCOUNTER — Ambulatory Visit: Payer: PRIVATE HEALTH INSURANCE

## 2014-01-25 DIAGNOSIS — IMO0001 Reserved for inherently not codable concepts without codable children: Secondary | ICD-10-CM | POA: Insufficient documentation

## 2014-01-25 DIAGNOSIS — M25519 Pain in unspecified shoulder: Secondary | ICD-10-CM | POA: Insufficient documentation

## 2014-01-25 DIAGNOSIS — M25619 Stiffness of unspecified shoulder, not elsewhere classified: Secondary | ICD-10-CM | POA: Diagnosis not present

## 2014-01-27 ENCOUNTER — Ambulatory Visit: Payer: PRIVATE HEALTH INSURANCE

## 2014-01-27 DIAGNOSIS — IMO0001 Reserved for inherently not codable concepts without codable children: Secondary | ICD-10-CM | POA: Diagnosis not present

## 2014-02-01 ENCOUNTER — Ambulatory Visit: Payer: PRIVATE HEALTH INSURANCE | Admitting: Physical Therapy

## 2014-02-01 DIAGNOSIS — IMO0001 Reserved for inherently not codable concepts without codable children: Secondary | ICD-10-CM | POA: Diagnosis not present

## 2014-02-03 ENCOUNTER — Ambulatory Visit: Payer: PRIVATE HEALTH INSURANCE | Admitting: Physical Therapy

## 2014-02-03 DIAGNOSIS — IMO0001 Reserved for inherently not codable concepts without codable children: Secondary | ICD-10-CM | POA: Diagnosis not present

## 2014-02-10 ENCOUNTER — Ambulatory Visit: Payer: PRIVATE HEALTH INSURANCE | Admitting: Physical Therapy

## 2014-02-10 DIAGNOSIS — IMO0001 Reserved for inherently not codable concepts without codable children: Secondary | ICD-10-CM | POA: Diagnosis not present

## 2014-03-22 ENCOUNTER — Emergency Department (HOSPITAL_BASED_OUTPATIENT_CLINIC_OR_DEPARTMENT_OTHER)
Admission: EM | Admit: 2014-03-22 | Discharge: 2014-03-22 | Disposition: A | Discharge status: Home / Self Care | Payer: 59 | Attending: Emergency Medicine | Admitting: Emergency Medicine

## 2014-03-22 ENCOUNTER — Encounter (HOSPITAL_BASED_OUTPATIENT_CLINIC_OR_DEPARTMENT_OTHER): Payer: Self-pay | Admitting: Emergency Medicine

## 2014-03-22 DIAGNOSIS — Z7982 Long term (current) use of aspirin: Secondary | ICD-10-CM | POA: Insufficient documentation

## 2014-03-22 DIAGNOSIS — Z8739 Personal history of other diseases of the musculoskeletal system and connective tissue: Secondary | ICD-10-CM | POA: Insufficient documentation

## 2014-03-22 DIAGNOSIS — H1011 Acute atopic conjunctivitis, right eye: Secondary | ICD-10-CM | POA: Insufficient documentation

## 2014-03-22 DIAGNOSIS — L03213 Periorbital cellulitis: Secondary | ICD-10-CM

## 2014-03-22 DIAGNOSIS — H5711 Ocular pain, right eye: Secondary | ICD-10-CM | POA: Diagnosis present

## 2014-03-22 DIAGNOSIS — Z8639 Personal history of other endocrine, nutritional and metabolic disease: Secondary | ICD-10-CM | POA: Diagnosis not present

## 2014-03-22 DIAGNOSIS — Z79899 Other long term (current) drug therapy: Secondary | ICD-10-CM | POA: Diagnosis not present

## 2014-03-22 DIAGNOSIS — H109 Unspecified conjunctivitis: Secondary | ICD-10-CM

## 2014-03-22 DIAGNOSIS — H05019 Cellulitis of unspecified orbit: Secondary | ICD-10-CM | POA: Diagnosis not present

## 2014-03-22 MED ORDER — GENTAMICIN SULFATE 0.3 % OP SOLN: 2.0000 [drp] | Freq: Four times a day (QID) | OPHTHALMIC | Status: DC

## 2014-03-22 MED ORDER — CEPHALEXIN 500 MG PO CAPS: 500.0000 mg | ORAL_CAPSULE | Freq: Four times a day (QID) | ORAL | Status: DC

## 2014-03-22 NOTE — Discharge Instructions (Signed)
Keflex as prescribed.  Gentamicin eyedrops as prescribed.  Return to the emergency department if your symptoms substantially worsen, and follow up with your ophthalmologist if not improving in the next 2-3 days.   Conjunctivitis Conjunctivitis is commonly called "pink eye." Conjunctivitis can be caused by bacterial or viral infection, allergies, or injuries. There is usually redness of the lining of the eye, itching, discomfort, and sometimes discharge. There may be deposits of matter along the eyelids. A viral infection usually causes a watery discharge, while a bacterial infection causes a yellowish, thick discharge. Pink eye is very contagious and spreads by direct contact. You may be given antibiotic eyedrops as part of your treatment. Before using your eye medicine, remove all drainage from the eye by washing gently with warm water and cotton balls. Continue to use the medication until you have awakened 2 mornings in a row without discharge from the eye. Do not rub your eye. This increases the irritation and helps spread infection. Use separate towels from other household members. Wash your hands with soap and water before and after touching your eyes. Use cold compresses to reduce pain and sunglasses to relieve irritation from light. Do not wear contact lenses or wear eye makeup until the infection is gone. SEEK MEDICAL CARE IF:   Your symptoms are not better after 3 days of treatment.  You have increased pain or trouble seeing.  The outer eyelids become very red or swollen. Document Released: 07/11/2004 Document Revised: 08/26/2011 Document Reviewed: 06/03/2005 [facility] Patient Information 2015 [place], [place]. This information is not intended to replace advice given to you by your health care provider. Make sure you discuss any questions you have with your health care provider.  Periorbital Cellulitis Periorbital cellulitis is a common infection that can affect the eyelid and the soft  tissues that surround the eyeball. The infection may also affect the structures that produce and drain tears. It does not affect the eyeball itself. Natural tissue barriers usually prevent the spread of this infection to the eyeball and other deeper areas of the eye socket.  CAUSES  Bacterial infection.  Long-term (chronic) sinus infections.  An object (foreign body) stuck behind the eye.  An injury that goes through the eyelid tissues.  An injury that causes an infection, such as an insect sting.  Fracture of the bone around the eye.  Infections which have spread from the eyelid or other structures around the eye.  Bite wounds.  Inflammation or infection of the lining membranes of the brain (meningitis).  An infection in the blood (septicemia).  Dental infection (abscess).  Viral infection (this is rare). SYMPTOMS Symptoms usually come on suddenly.  Pain in the eye.  Red, hot, and swollen eyelids and possibly cheeks. The swelling is sometimes bad enough that the eyelids cannot open. Some infections make the eyelids look purple.  Fever and feeling generally ill.  Pain when touching the area around the eye. DIAGNOSIS  Periorbital cellulitis can be diagnosed from an eye exam. In severe cases, your caregiver might suggest:  Blood tests.  Imaging tests (such as a CT scan) to examine the sinuses and the area around and behind the eyeball. TREATMENT If your caregiver feels that you do not have any signs of serious infection, treatment may include:  Antibiotics.  Nasal decongestants to reduce swelling.  Referral to a dentist if it is suspected that the infection was caused by a prior tooth infection.  Examination every day to make sure the problem is improving. HOME  CARE INSTRUCTIONS  Take your antibiotics as directed. Finish them even if you start to feel better.  Some pain is normal with this condition. Take pain medicine as directed by your caregiver. Only take  pain medicines approved by your caregiver.  It is important to drink fluids. Drink enough water and fluids to keep your urine clear or pale yellow.  Do not smoke.  Rest and get plenty of sleep.  Mild or moderate fevers generally have no long-term effects and often do not require treatment.  If your caregiver has given you a follow-up appointment, it is very important to keep that appointment. Your caregiver will need to make sure that the infection is getting better. It is important to check that a more serious infection is not developing. SEEK IMMEDIATE MEDICAL CARE IF:  Your eyelids become more painful, red, warm, or swollen.  You develop double vision or your vision becomes blurred or worsens in any way.  You have trouble moving your eyes.  The eye looks like it is popping out (proptosis).  You develop a severe headache, severe neck pain, or neck stiffness.  You develop repeated vomiting.  You have a fever or persistent symptoms for more than 72 hours.  You have a fever and your symptoms suddenly get worse. MAKE SURE YOU:  Understand these instructions.  Will watch your condition.  Will get help right away if you are not doing well or get worse. Document Released: 07/06/2010 Document Revised: 08/26/2011 Document Reviewed: 07/06/2010 [facility] Patient Information 2015 [place], [place]. This information is not intended to replace advice given to you by your health care provider. Make sure you discuss any questions you have with your health care provider.

## 2014-03-22 NOTE — ED Notes (Signed)
MD at bedside. 

## 2014-03-22 NOTE — ED Notes (Signed)
Pt reports onset of right eye pain 1 day ago denies event or injury, eye constantly watering but no purulent drainage noted

## 2014-03-22 NOTE — ED Provider Notes (Signed)
CSN: [id number]     Arrival date & time 03/22/14  7510 History   First MD Initiated Contact with Patient 03/22/14 0725     Chief Complaint  Patient presents with  . Eye Pain     (Consider location/radiation/quality/duration/timing/severity/associated sxs/prior Treatment) HPI Comments: Patient is a 53 year old female who presents with complaints of red eye swelling, clear drainage for the past 2 days. She denies any injury or trauma. She is not a contact lens user.  Patient is a 53 y.o. female presenting with eye pain. The history is provided by the patient.  Eye Pain This is a new problem. The current episode started yesterday. The problem occurs constantly. The problem has been rapidly worsening. Nothing aggravates the symptoms. Nothing relieves the symptoms. She has tried nothing for the symptoms. The treatment provided no relief.    Past Medical History  Diagnosis Date  . DJD (degenerative joint disease) of hip     Dr Maureen Ralphs  . Hyperlipidemia    Past Surgical History  Procedure Laterality Date  . Tonsillectomy and adenoidectomy    . Bunionectomy      Dr Blenda Mounts  . G 3 p 1    . Total hip arthroplasty  04/28/2012    Procedure: TOTAL HIP ARTHROPLASTY ANTERIOR APPROACH;  Surgeon: Mauri Pole, MD;  Location: [facility];  Service: Orthopedics;  Laterality: Right;  . Colonoscopy  2013    negative; Dr Carlean Purl   Family History  Problem Relation Age of Onset  . Stroke Mother 62  . Hypertension Mother   . Hyperlipidemia Mother   . Osteoporosis Mother   . Heart attack Father 20  . Breast cancer Sister   . Hyperlipidemia Sister   . Hypertension Sister   . Cancer Maternal Grandfather      intraabdominal  . Diabetes Sister    History  Substance Use Topics  . Smoking status: Never Smoker   . Smokeless tobacco: Never Used  . Alcohol Use: 2.4 oz/week    4 Glasses of wine per week     Comment:     OB History   Grav Para Term Preterm Abortions TAB SAB Ect Mult Living                  Review of Systems  Eyes: Positive for pain.  All other systems reviewed and are negative.     Allergies  Review of patient's allergies indicates no known allergies.  Home Medications   Prior to Admission medications   Medication Sig Start Date End Date Taking? Authorizing Provider  aspirin EC 325 MG tablet Take 1 tablet (325 mg total) by mouth 2 (two) times daily. X 4 weeks 04/29/12   Lucille Passy Babish, PA-C  calcium-vitamin D (OSCAL WITH D) 500-200 MG-UNIT per tablet Take 1 tablet by mouth daily.    Historical Provider, MD  ferrous sulfate 325 (65 FE) MG tablet Take 1 tablet (325 mg total) by mouth 3 (three) times daily after meals. 04/29/12   Lucille Passy Babish, PA-C  Glucosamine-Chondroit-Vit C-Mn (GLUCOSAMINE CHONDR 1500 COMPLX PO) Take 1-2 tablets by mouth daily.     Historical Provider, MD  Multiple Vitamin (MULTIVITAMIN) tablet Take 1 tablet by mouth daily.    Historical Provider, MD   BP 121/67  Pulse 74  Temp(Src) 98.3 F (36.8 C) (Oral)  Resp 16  SpO2 98%  LMP 10/30/2012 Physical Exam  Nursing note and vitals reviewed. Constitutional: She is oriented to person, place, and time. She appears  well-developed and well-nourished. No distress.  HENT:  Head: Normocephalic and atraumatic.  Eyes: EOM are normal. Pupils are equal, round, and reactive to light.  There is swelling and erythema to the soft tissue surrounding the right eye. The conjunctiva is mildly injected with clear discharge. There is no discomfort with extraocular muscle movement.  Neck: Normal range of motion. Neck supple.  Musculoskeletal: Normal range of motion. She exhibits no edema.  Neurological: She is alert and oriented to person, place, and time.  Skin: Skin is warm and dry. She is not diaphoretic.    ED Course  Procedures (including critical care time) Labs Review Labs Reviewed - No data to display  Imaging Review No results found.   EKG Interpretation None      MDM    Final diagnoses:  None    This appears to be either a periorbital cellulitis or conjunctivitis with local tissue reaction. She will be treated with Keflex and antibiotic eyedrops. To return as needed.    Veryl Speak, MD 03/22/14 [phone]

## 2014-04-01 ENCOUNTER — Other Ambulatory Visit: Payer: Self-pay

## 2015-01-13 ENCOUNTER — Encounter: Payer: Self-pay | Admitting: Internal Medicine

## 2015-01-13 ENCOUNTER — Ambulatory Visit (INDEPENDENT_AMBULATORY_CARE_PROVIDER_SITE_OTHER): Payer: 59 | Admitting: Internal Medicine

## 2015-01-13 ENCOUNTER — Other Ambulatory Visit: Payer: 59

## 2015-01-13 VITALS — BP 130/88 | HR 70 | Temp 98.1°F | Resp 16 | Wt 154.0 lb

## 2015-01-13 DIAGNOSIS — R3915 Urgency of urination: Secondary | ICD-10-CM

## 2015-01-13 DIAGNOSIS — IMO0001 Reserved for inherently not codable concepts without codable children: Secondary | ICD-10-CM

## 2015-01-13 DIAGNOSIS — R300 Dysuria: Secondary | ICD-10-CM | POA: Diagnosis not present

## 2015-01-13 DIAGNOSIS — R350 Frequency of micturition: Secondary | ICD-10-CM

## 2015-01-13 LAB — POCT URINALYSIS DIPSTICK
BILIRUBIN UA: NEGATIVE
GLUCOSE UA: NEGATIVE
Ketones, UA: NEGATIVE
Nitrite, UA: NEGATIVE
PH UA: 6
Spec Grav, UA: 1.01
Urobilinogen, UA: 0.2

## 2015-01-13 MED ORDER — PHENAZOPYRIDINE HCL 200 MG PO TABS: 200.0000 mg | ORAL_TABLET | Freq: Three times a day (TID) | ORAL | Status: DC | PRN

## 2015-01-13 MED ORDER — NITROFURANTOIN MONOHYD MACRO 100 MG PO CAPS: 100.0000 mg | ORAL_CAPSULE | Freq: Two times a day (BID) | ORAL | Status: DC

## 2015-01-13 NOTE — Progress Notes (Signed)
   Subjective:    Patient ID: Shelby Calhoun, female    DOB: 21-Jun-1960, 53 y.o.   MRN: [id number]  HPI Her symptoms began this morning when upon first voiding she had dysuria. She noted frequency and urgency as well as malodorous urine. This progressed to frankly blood-tinged urine.  She has no flank pain or abdominal pain.  She's been amenorrheic for 2 years.   She has no history of urinary tract infections or genitourinary issues. She's not a diabetic.  She has had positive nasal swab for MRSA.   Review of Systems  She's had no fever, chills, sweats, or weight loss.  She has no abdominal pain, constipation, diarrhea, rectal bleeding, or melena.     Objective:   Physical Exam  General appearance is one of good health and nourishment w/o distress.  Eyes: No conjunctival inflammation or scleral icterus is present.  Heart:  Normal rate and regular rhythm. S1 and S2 normal without gallop, murmur, click, rub or other extra sounds    Lungs:Chest clear to auscultation; no wheezes, rhonchi,rales ,or rubs present.No increased work of breathing.   Abdomen: bowel sounds normal, soft and non-tender without masses, organomegaly or hernias noted.  No guarding or rebound . No tenderness over the flanks to percussion  Musculoskeletal: Able to lie flat and sit up without help. Negative straight leg raising bilaterally. Gait normal  Skin:Warm & dry.  Intact without suspicious lesions or rashes ; no jaundice or tenting  Lymphatic: No lymphadenopathy is noted about the head, neck, axilla             Assessment & Plan:  #1 dysuria, frequency, urgency, and malodorous urine; probable urinary tract infection  Plan: As per standard of care she'll be placed on nitrofurantoin pending results of the culture and sensitivity.

## 2015-01-13 NOTE — Patient Instructions (Signed)
Drink as much nondairy fluids as possible. Avoid spicy foods or alcohol as  these may aggravate the bladder. Do not take decongestants. Avoid narcotics if possible. 

## 2015-01-13 NOTE — Progress Notes (Signed)
Patient received education resource, including the self-management goal and tool. Patient verbalized understanding. 

## 2015-01-16 LAB — URINE CULTURE: Colony Count: >=100000

## 2015-09-13 IMAGING — CR DG SHOULDER 2+V*L*
3 series · 3 of 3 positions shown · non-contrast
Comparison: None.

CLINICAL DATA: Status post fall now with left shoulder pain

EXAM:
LEFT SHOULDER - 2+ VIEW

[view not recorded (1 of 3)]
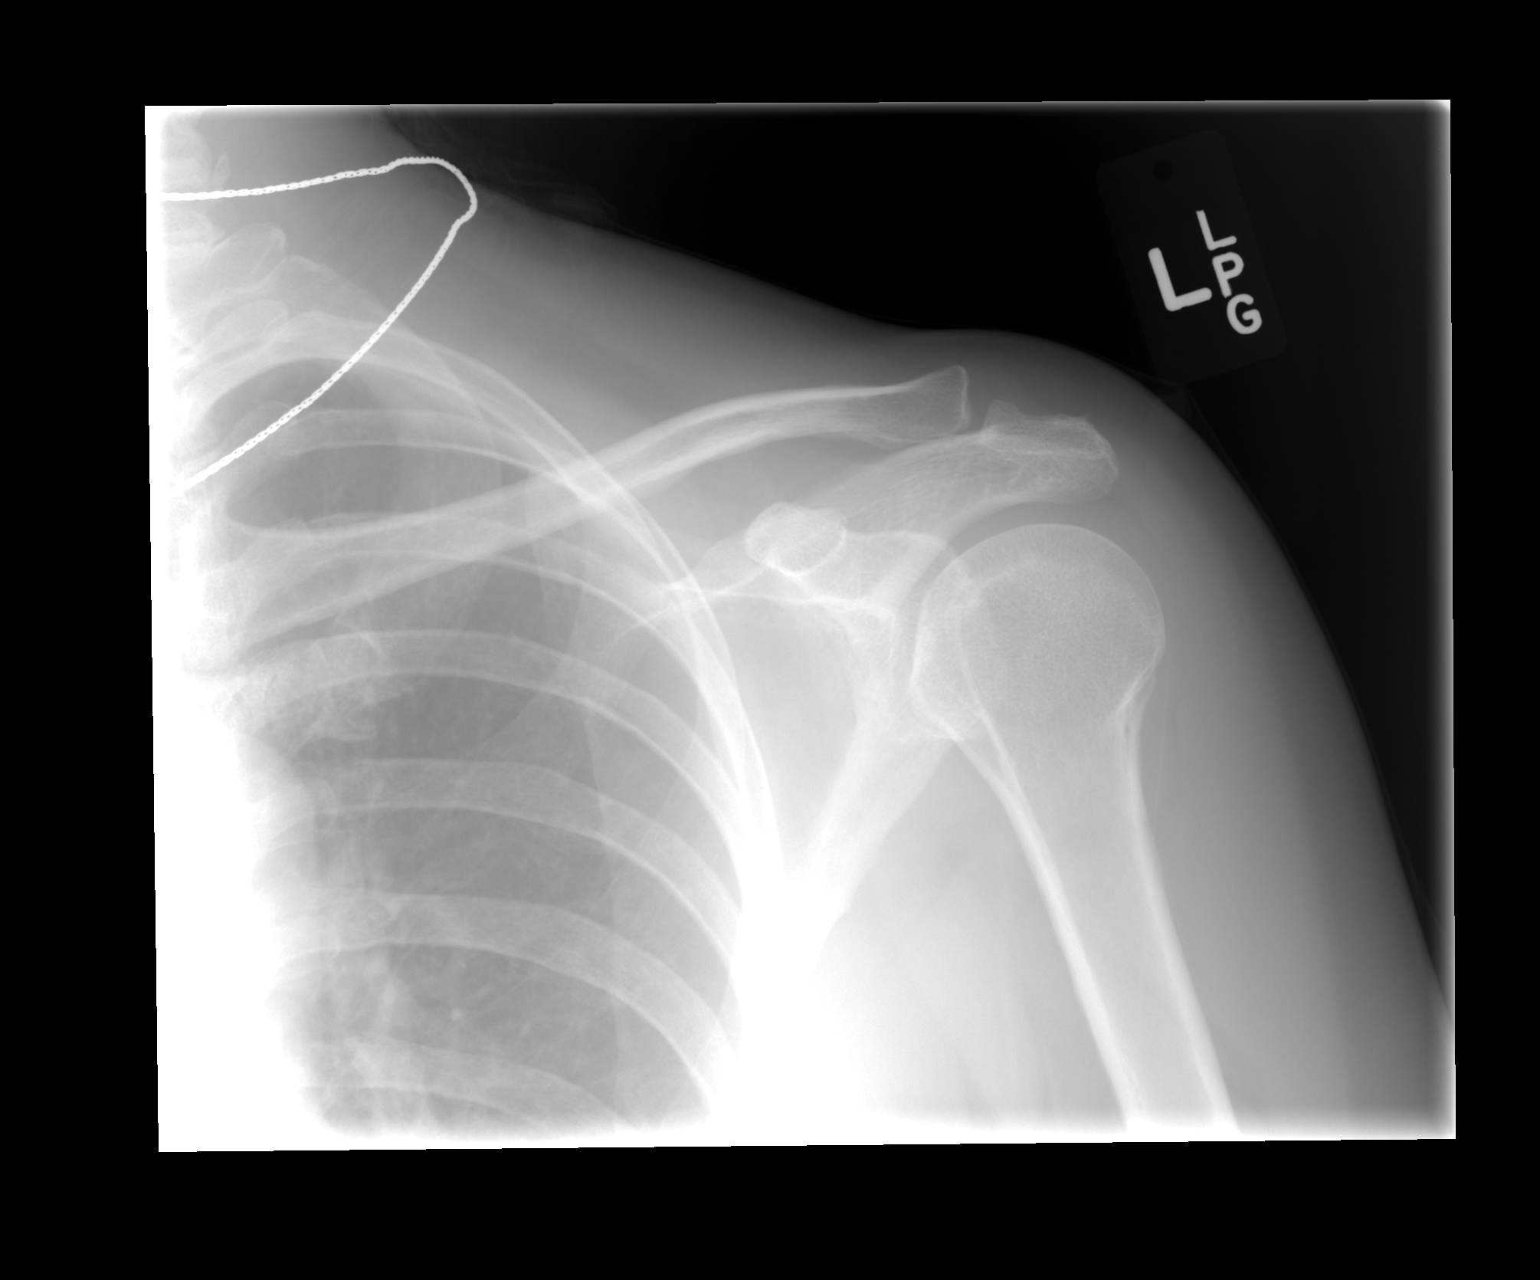

[view not recorded (2 of 3)]
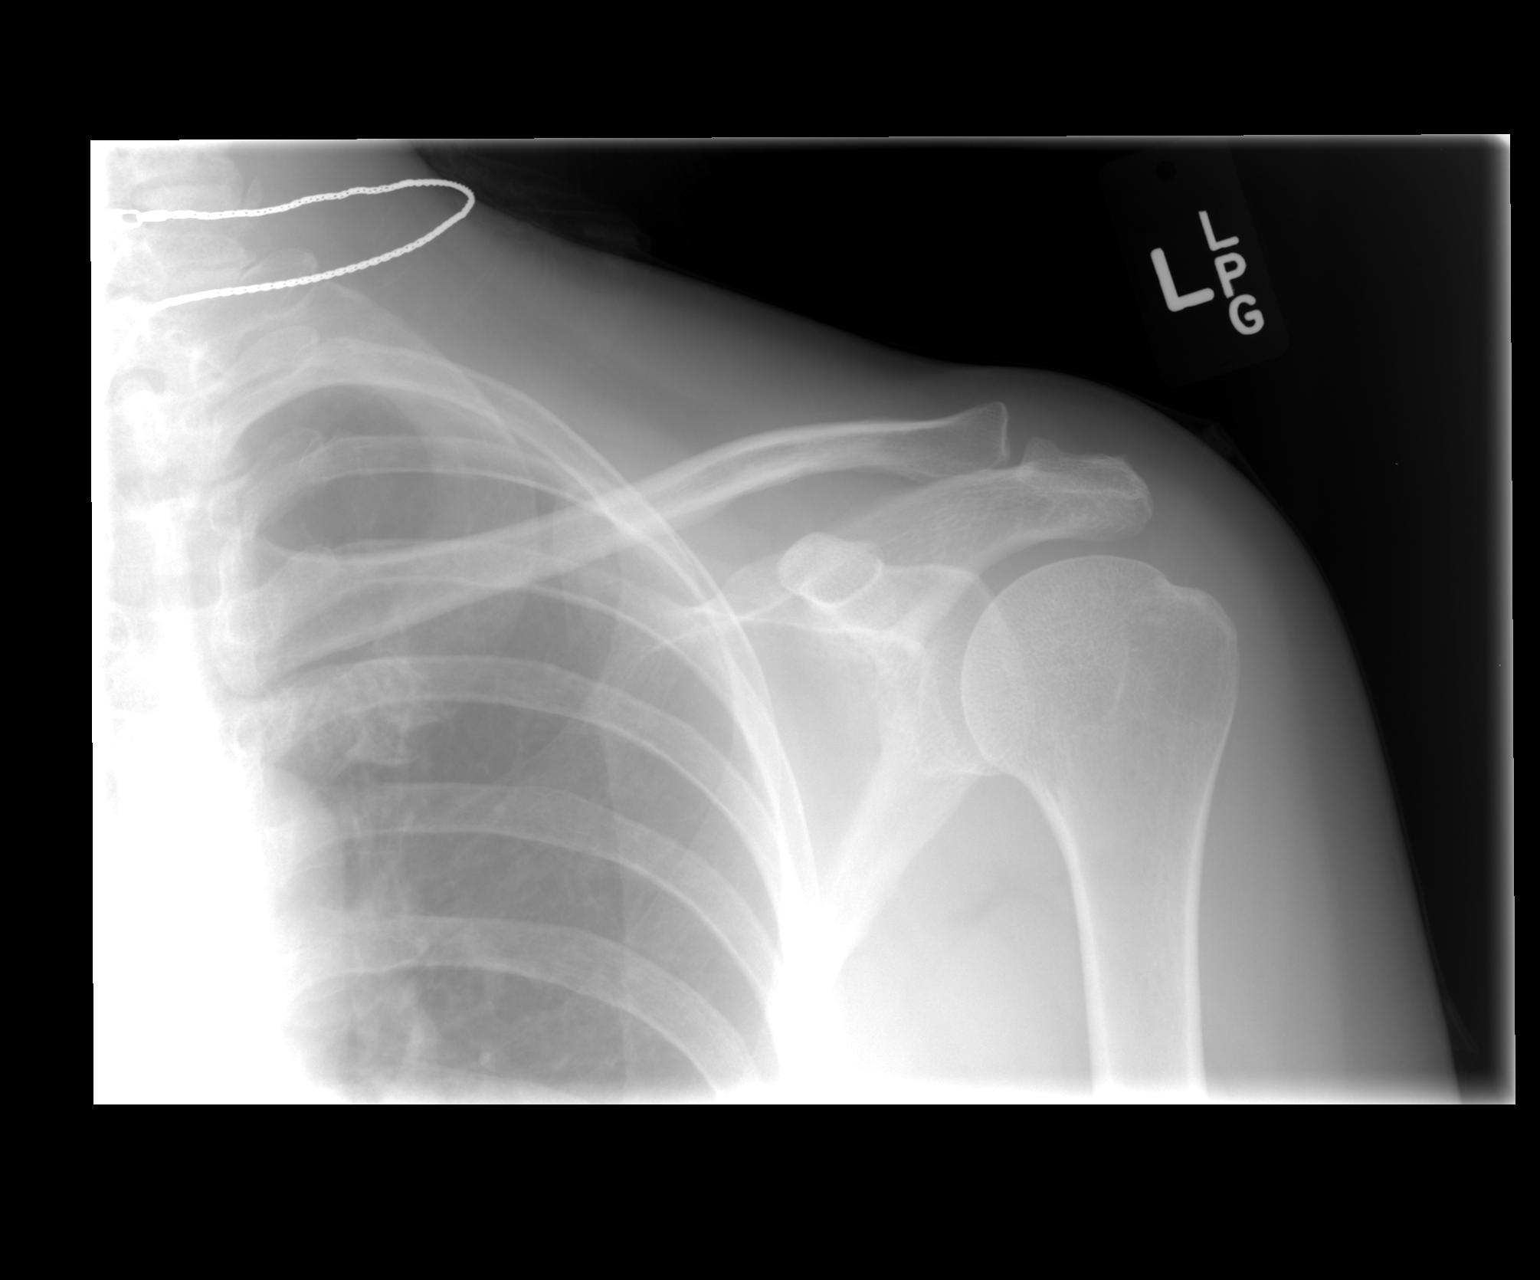

[view not recorded (3 of 3)]
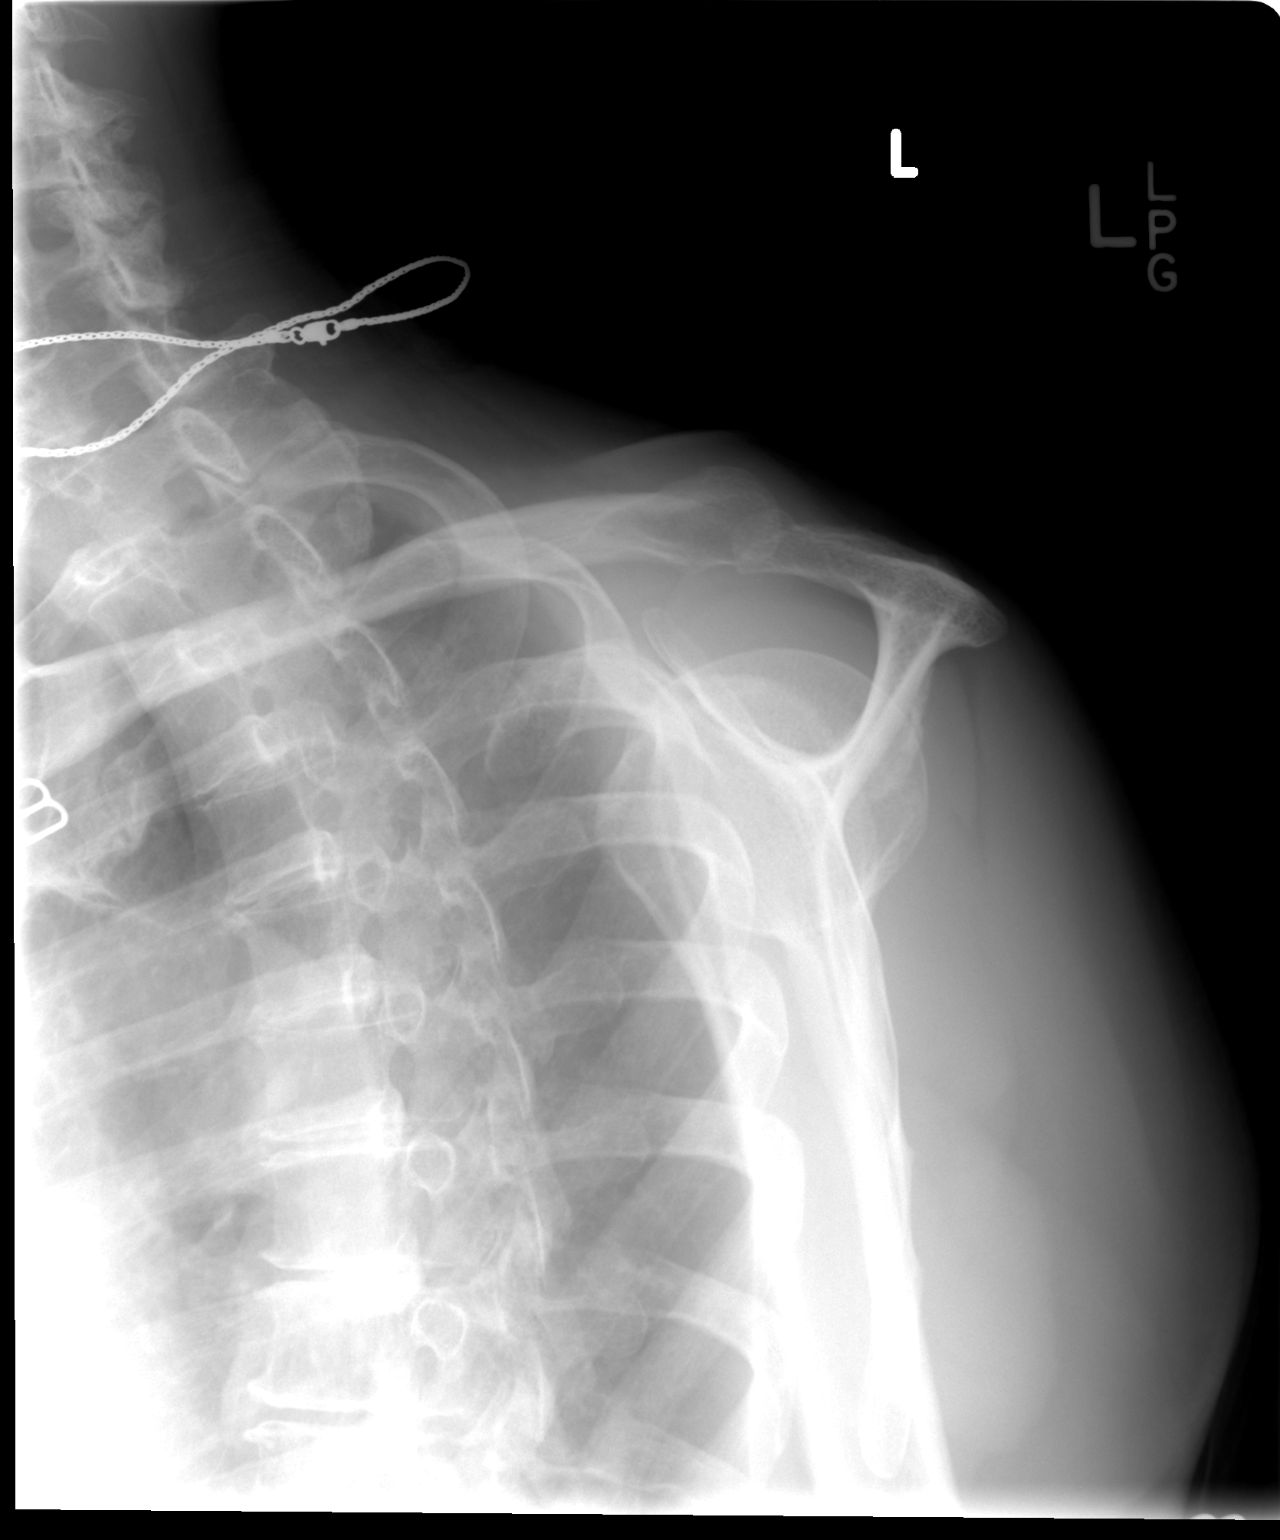

[3 of 3 positions shown; findings below may reference images not displayed]

FINDINGS: The bones are adequately mineralized. There is no acute fracture nor
dislocation. There is no significant degenerative change. The
observed portions of the left clavicle and upper left ribs are
normal. The overlying soft tissues are normal.
IMPRESSION: There is no acute bony abnormality of the left shoulder.

## 2016-04-16 DIAGNOSIS — Z01419 Encounter for gynecological examination (general) (routine) without abnormal findings: Secondary | ICD-10-CM | POA: Diagnosis not present

## 2016-04-16 DIAGNOSIS — Z6827 Body mass index (BMI) 27.0-27.9, adult: Secondary | ICD-10-CM | POA: Diagnosis not present

## 2016-04-16 DIAGNOSIS — Z1231 Encounter for screening mammogram for malignant neoplasm of breast: Secondary | ICD-10-CM | POA: Diagnosis not present

## 2016-07-07 NOTE — Progress Notes (Signed)
Subjective:    Patient ID: Shelby Calhoun, female    DOB: 18-Nov-1960, 56 y.o.   MRN: [id number]  HPI She is here to establish with a new pcp.  She is here for a physical exam.   She has an enlarged swollen knuckle of her right third finger and it is painful at times.  She has some mild pain in other joints but it is not severe.  She does pilates once a week.  She has gained weight.  She knows she needs to exercise more.   Medications and allergies reviewed with patient and updated if appropriate.  Patient Active Problem List   Diagnosis Date Noted  . HYPERLIPIDEMIA 03/29/2010    Current Outpatient Prescriptions on File Prior to Visit  Medication Sig Dispense Refill  . Glucosamine-Chondroit-Vit C-Mn (GLUCOSAMINE CHONDR 1500 COMPLX PO) Take 1-2 tablets by mouth daily.      No current facility-administered medications on file prior to visit.     Past Medical History:  Diagnosis Date  . DJD (degenerative joint disease) of hip    Dr Maureen Ralphs  . Hyperlipidemia     Past Surgical History:  Procedure Laterality Date  . BUNIONECTOMY     Dr Blenda Mounts  . COLONOSCOPY  2013   negative; Dr Carlean Purl  . G 3 P 1    . TONSILLECTOMY AND ADENOIDECTOMY    . TOTAL HIP ARTHROPLASTY  04/28/2012   Procedure: TOTAL HIP ARTHROPLASTY ANTERIOR APPROACH;  Surgeon: Mauri Pole, MD;  Location: [facility];  Service: Orthopedics;  Laterality: Right;    Social History   Social History  . Marital status: Married    Spouse name: N/A  . Number of children: N/A  . Years of education: N/A   Social History Main Topics  . Smoking status: Never Smoker  . Smokeless tobacco: Never Used  . Alcohol use 2.4 oz/week    4 Glasses of wine per week     Comment:    . Drug use: No  . Sexual activity: Yes   Other Topics Concern  . None   Social History Narrative  . None    Family History  Problem Relation Age of Onset  . Stroke Mother 56  . Hypertension Mother   . Hyperlipidemia Mother   .  Osteoporosis Mother   . Heart attack Father 30  . Breast cancer Sister   . Hyperlipidemia Sister   . Hypertension Sister   . Diabetes Sister   . Cancer Maternal Grandfather      intraabdominal    Review of Systems  Constitutional: Negative for appetite change, chills, fatigue, fever and unexpected weight change.  Eyes: Negative for visual disturbance.  Respiratory: Negative for cough, shortness of breath and wheezing.   Cardiovascular: Negative for chest pain, palpitations and leg swelling.  Gastrointestinal: Negative for abdominal pain, blood in stool, constipation, diarrhea and nausea.       No gerd  Genitourinary: Negative for dysuria and hematuria.  Musculoskeletal: Positive for arthralgias (hand).  Skin: Negative for color change and rash.  Neurological: Negative for dizziness, light-headedness and headaches.  Psychiatric/Behavioral: Negative for dysphoric mood and sleep disturbance. The patient is not nervous/anxious.        Objective:   Vitals:   07/08/16 1509  BP: 108/72  Pulse: 88  Resp: 16  Temp: 98.4 F (36.9 C)   Filed Weights   07/08/16 1509  Weight: 161 lb (73 kg)   Body mass index is 26.79 kg/m.  Wt  Readings from Last 3 Encounters:  07/08/16 161 lb (73 kg)  01/13/15 154 lb (69.9 kg)  07/02/13 152 lb 3.2 oz (69 kg)     Physical Exam Constitutional: She appears well-developed and well-nourished. No distress.  HENT:  Head: Normocephalic and atraumatic.  Right Ear: External ear normal. Normal ear canal and TM Left Ear: External ear normal.  Normal ear canal and TM Mouth/Throat: Oropharynx is clear and moist.  Eyes: Conjunctivae and EOM are normal.  Neck: Neck supple. No tracheal deviation present. No thyromegaly present.  No carotid bruit  Cardiovascular: Normal rate, regular rhythm and normal heart sounds.   No murmur heard.  No edema. Pulmonary/Chest: Effort normal and breath sounds normal. No respiratory distress. She has no wheezes. She has  no rales.  Breast: deferred to Gyn Abdominal: Soft. She exhibits no distension. There is no tenderness.  Lymphadenopathy: She has no cervical adenopathy.  Skin: Skin is warm and dry. She is not diaphoretic.  Psychiatric: She has a normal mood and affect. Her behavior is normal.         Assessment & Plan:   Physical exam: Screening blood work  ordered Immunizations  Up to date  Colonoscopy   Up to date  Mammogram  Up to date  Gyn  Up to date  Eye exams  Up to date  EKG  Last EKG 2013 Exercise - doing pilates once a week, advised increasing exercise Weight - will work on weight loss Skin  - no concerns Substance abuse  none  See Problem List for Assessment and Plan of chronic medical problems.   FU annually

## 2016-07-08 ENCOUNTER — Encounter: Payer: Self-pay | Admitting: Internal Medicine

## 2016-07-08 ENCOUNTER — Ambulatory Visit (INDEPENDENT_AMBULATORY_CARE_PROVIDER_SITE_OTHER): Payer: 59 | Admitting: Internal Medicine

## 2016-07-08 VITALS — BP 108/72 | HR 88 | Temp 98.4°F | Resp 16 | Ht 65.0 in | Wt 161.0 lb

## 2016-07-08 DIAGNOSIS — Z1159 Encounter for screening for other viral diseases: Secondary | ICD-10-CM

## 2016-07-08 DIAGNOSIS — E7800 Pure hypercholesterolemia, unspecified: Secondary | ICD-10-CM

## 2016-07-08 DIAGNOSIS — Z114 Encounter for screening for human immunodeficiency virus [HIV]: Secondary | ICD-10-CM | POA: Diagnosis not present

## 2016-07-08 DIAGNOSIS — Z0000 Encounter for general adult medical examination without abnormal findings: Secondary | ICD-10-CM

## 2016-07-08 NOTE — Patient Instructions (Signed)
Test(s) ordered today. Your results will be released to [place] (or called to you) after review, usually within 72hours after test completion. If any changes need to be made, you will be notified at that same time.  All other Health Maintenance issues reviewed.   All recommended immunizations and age-appropriate screenings are up-to-date or discussed.  No immunizations administered today.   Medications reviewed and updated.  No changes recommended at this time.  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   Please followup in one year   Health Maintenance, Female Introduction Adopting a healthy lifestyle and getting preventive care can go a long way to promote health and wellness. Talk with your health care provider about what schedule of regular examinations is right for you. This is a good chance for you to check in with your provider about disease prevention and staying healthy. In between checkups, there are plenty of things you can do on your own. Experts have done a lot of research about which lifestyle changes and preventive measures are most likely to keep you healthy. Ask your health care provider for more information. Weight and diet Eat a healthy diet  Be sure to include plenty of vegetables, fruits, low-fat dairy products, and lean protein.  Do not eat a lot of foods high in solid fats, added sugars, or salt.  Get regular exercise. This is one of the most important things you can do for your health.  Most adults should exercise for at least 150 minutes each week. The exercise should increase your heart rate and make you sweat (moderate-intensity exercise).  Most adults should also do strengthening exercises at least twice a week. This is in addition to the moderate-intensity exercise. Maintain a healthy weight  Body mass index (BMI) is a measurement that can be used to identify possible weight problems. It estimates body fat based on height and weight. Your  health care provider can help determine your BMI and help you achieve or maintain a healthy weight.  For females 36 years of age and older:  A BMI below 18.5 is considered underweight.  A BMI of 18.5 to 24.9 is normal.  A BMI of 25 to 29.9 is considered overweight.  A BMI of 30 and above is considered obese. Watch levels of cholesterol and blood lipids  You should start having your blood tested for lipids and cholesterol at 56 years of age, then have this test every 5 years.  You may need to have your cholesterol levels checked more often if:  Your lipid or cholesterol levels are high.  You are older than 56 years of age.  You are at high risk for heart disease. Cancer screening Lung Cancer  Lung cancer screening is recommended for adults 76-28 years old who are at high risk for lung cancer because of a history of smoking.  A yearly low-dose CT scan of the lungs is recommended for people who:  Currently smoke.  Have quit within the past 15 years.  Have at least a 30-pack-year history of smoking. A pack year is smoking an average of one pack of cigarettes a day for 1 year.  Yearly screening should continue until it has been 15 years since you quit.  Yearly screening should stop if you develop a health problem that would prevent you from having lung cancer treatment. Breast Cancer  Practice breast self-awareness. This means understanding how your breasts normally appear and feel.  It also means doing regular breast self-exams. Let  your health care provider know about any changes, no matter how small.  If you are in your 20s or 30s, you should have a clinical breast exam (CBE) by a health care provider every 1-3 years as part of a regular health exam.  If you are 3 or older, have a CBE every year. Also consider having a breast X-ray (mammogram) every year.  If you have a family history of breast cancer, talk to your health care provider about genetic screening.  If you  are at high risk for breast cancer, talk to your health care provider about having an MRI and a mammogram every year.  Breast cancer gene (BRCA) assessment is recommended for women who have family members with BRCA-related cancers. BRCA-related cancers include:  Breast.  Ovarian.  Tubal.  Peritoneal cancers.  Results of the assessment will determine the need for genetic counseling and BRCA1 and BRCA2 testing. Cervical Cancer  Your health care provider may recommend that you be screened regularly for cancer of the pelvic organs (ovaries, uterus, and vagina). This screening involves a pelvic examination, including checking for microscopic changes to the surface of your cervix (Pap test). You may be encouraged to have this screening done every 3 years, beginning at age 68.  For women ages 3-65, health care providers may recommend pelvic exams and Pap testing every 3 years, or they may recommend the Pap and pelvic exam, combined with testing for human papilloma virus (HPV), every 5 years. Some types of HPV increase your risk of cervical cancer. Testing for HPV may also be done on women of any age with unclear Pap test results.  Other health care providers may not recommend any screening for nonpregnant women who are considered low risk for pelvic cancer and who do not have symptoms. Ask your health care provider if a screening pelvic exam is right for you.  If you have had past treatment for cervical cancer or a condition that could lead to cancer, you need Pap tests and screening for cancer for at least 20 years after your treatment. If Pap tests have been discontinued, your risk factors (such as having a new sexual partner) need to be reassessed to determine if screening should resume. Some women have medical problems that increase the chance of getting cervical cancer. In these cases, your health care provider may recommend more frequent screening and Pap tests. Colorectal Cancer  This type of  cancer can be detected and often prevented.  Routine colorectal cancer screening usually begins at 56 years of age and continues through 56 years of age.  Your health care provider may recommend screening at an earlier age if you have risk factors for colon cancer.  Your health care provider may also recommend using home test kits to check for hidden blood in the stool.  A small camera at the end of a tube can be used to examine your colon directly (sigmoidoscopy or colonoscopy). This is done to check for the earliest forms of colorectal cancer.  Routine screening usually begins at age 14.  Direct examination of the colon should be repeated every 5-10 years through 56 years of age. However, you may need to be screened more often if early forms of precancerous polyps or small growths are found. Skin Cancer  Check your skin from head to toe regularly.  Tell your health care provider about any new moles or changes in moles, especially if there is a change in a mole's shape or color.  Also tell  your health care provider if you have a mole that is larger than the size of a pencil eraser.  Always use sunscreen. Apply sunscreen liberally and repeatedly throughout the day.  Protect yourself by wearing long sleeves, pants, a wide-brimmed hat, and sunglasses whenever you are outside. Heart disease, diabetes, and high blood pressure  High blood pressure causes heart disease and increases the risk of stroke. High blood pressure is more likely to develop in:  People who have blood pressure in the high end of the normal range (130-139/85-89 mm Hg).  People who are overweight or obese.  People who are African American.  If you are 63-63 years of age, have your blood pressure checked every 3-5 years. If you are 1 years of age or older, have your blood pressure checked every year. You should have your blood pressure measured twice-once when you are at a hospital or clinic, and once when you are not  at a hospital or clinic. Record the average of the two measurements. To check your blood pressure when you are not at a hospital or clinic, you can use:  An automated blood pressure machine at a pharmacy.  A home blood pressure monitor.  If you are between 11 years and 57 years old, ask your health care provider if you should take aspirin to prevent strokes.  Have regular diabetes screenings. This involves taking a blood sample to check your fasting blood sugar level.  If you are at a normal weight and have a low risk for diabetes, have this test once every three years after 56 years of age.  If you are overweight and have a high risk for diabetes, consider being tested at a younger age or more often. Preventing infection Hepatitis B  If you have a higher risk for hepatitis B, you should be screened for this virus. You are considered at high risk for hepatitis B if:  You were born in a country where hepatitis B is common. Ask your health care provider which countries are considered high risk.  Your parents were born in a high-risk country, and you have not been immunized against hepatitis B (hepatitis B vaccine).  You have HIV or AIDS.  You use needles to inject street drugs.  You live with someone who has hepatitis B.  You have had sex with someone who has hepatitis B.  You get hemodialysis treatment.  You take certain medicines for conditions, including cancer, organ transplantation, and autoimmune conditions. Hepatitis C  Blood testing is recommended for:  Everyone born from [birth year] through 1965.  Anyone with known risk factors for hepatitis C. Sexually transmitted infections (STIs)  You should be screened for sexually transmitted infections (STIs) including gonorrhea and chlamydia if:  You are sexually active and are younger than 56 years of age.  You are older than 56 years of age and your health care provider tells you that you are at risk for this type of  infection.  Your sexual activity has changed since you were last screened and you are at an increased risk for chlamydia or gonorrhea. Ask your health care provider if you are at risk.  If you do not have HIV, but are at risk, it may be recommended that you take a prescription medicine daily to prevent HIV infection. This is called pre-exposure prophylaxis (PrEP). You are considered at risk if:  You are sexually active and do not regularly use condoms or know the HIV status of your partner(s).  You take  drugs by injection.  You are sexually active with a partner who has HIV. Talk with your health care provider about whether you are at high risk of being infected with HIV. If you choose to begin PrEP, you should first be tested for HIV. You should then be tested every 3 months for as long as you are taking PrEP. Pregnancy  If you are premenopausal and you may become pregnant, ask your health care provider about preconception counseling.  If you may become pregnant, take 400 to 800 micrograms (mcg) of folic acid every day.  If you want to prevent pregnancy, talk to your health care provider about birth control (contraception). Osteoporosis and menopause  Osteoporosis is a disease in which the bones lose minerals and strength with aging. This can result in serious bone fractures. Your risk for osteoporosis can be identified using a bone density scan.  If you are 81 years of age or older, or if you are at risk for osteoporosis and fractures, ask your health care provider if you should be screened.  Ask your health care provider whether you should take a calcium or vitamin D supplement to lower your risk for osteoporosis.  Menopause may have certain physical symptoms and risks.  Hormone replacement therapy may reduce some of these symptoms and risks. Talk to your health care provider about whether hormone replacement therapy is right for you. Follow these instructions at home:  Schedule  regular health, dental, and eye exams.  Stay current with your immunizations.  Do not use any tobacco products including cigarettes, chewing tobacco, or electronic cigarettes.  If you are pregnant, do not drink alcohol.  If you are breastfeeding, limit how much and how often you drink alcohol.  Limit alcohol intake to no more than 1 drink per day for nonpregnant women. One drink equals 12 ounces of beer, 5 ounces of wine, or 1 ounces of hard liquor.  Do not use street drugs.  Do not share needles.  Ask your health care provider for help if you need support or information about quitting drugs.  Tell your health care provider if you often feel depressed.  Tell your health care provider if you have ever been abused or do not feel safe at home. This information is not intended to replace advice given to you by your health care provider. Make sure you discuss any questions you have with your health care provider. Document Released: 12/17/2010 Document Revised: 11/09/2015 Document Reviewed: 03/07/2015  2017 [employer]

## 2016-07-08 NOTE — Progress Notes (Signed)
Pre visit review using our clinic review tool, if applicable. No additional management support is needed unless otherwise documented below in the visit note. 

## 2016-07-08 NOTE — Assessment & Plan Note (Signed)
positive family history Not on medications Check lipids Increase exercise Work on weight loss

## 2016-07-15 ENCOUNTER — Other Ambulatory Visit (HOSPITAL_COMMUNITY)
Admission: AD | Admit: 2016-07-15 | Discharge: 2016-07-15 | Disposition: A | Discharge status: Home / Self Care | Payer: 59 | Source: Ambulatory Visit | Attending: Internal Medicine | Admitting: Internal Medicine

## 2016-07-15 DIAGNOSIS — E7800 Pure hypercholesterolemia, unspecified: Secondary | ICD-10-CM | POA: Diagnosis not present

## 2016-07-15 DIAGNOSIS — Z1159 Encounter for screening for other viral diseases: Secondary | ICD-10-CM | POA: Diagnosis not present

## 2016-07-15 DIAGNOSIS — Z0000 Encounter for general adult medical examination without abnormal findings: Secondary | ICD-10-CM | POA: Insufficient documentation

## 2016-07-15 DIAGNOSIS — Z114 Encounter for screening for human immunodeficiency virus [HIV]: Secondary | ICD-10-CM | POA: Insufficient documentation

## 2016-07-15 LAB — COMPREHENSIVE METABOLIC PANEL
ALBUMIN: 4.3 g/dL (ref 3.5–5.0)
ALT: 22 U/L (ref 14–54)
AST: 23 U/L (ref 15–41)
Alkaline Phosphatase: 84 U/L (ref 38–126)
Anion gap: 9 (ref 5–15)
BILIRUBIN TOTAL: 0.7 mg/dL (ref 0.3–1.2)
BUN: 10 mg/dL (ref 6–20)
CO2: 26 mmol/L (ref 22–32)
Calcium: 9.2 mg/dL (ref 8.9–10.3)
Chloride: 104 mmol/L (ref 101–111)
Creatinine, Ser: 0.77 mg/dL (ref 0.44–1.00)
GFR calc Af Amer: >60 mL/min (ref >60)
GFR calc non Af Amer: >60 mL/min (ref >60)
Glucose, Bld: 93 mg/dL (ref 65–99)
POTASSIUM: 4.2 mmol/L (ref 3.5–5.1)
Sodium: 139 mmol/L (ref 135–145)
Total Protein: 7.3 g/dL (ref 6.5–8.1)

## 2016-07-15 LAB — CBC
HCT: 41.4 % (ref 36.0–46.0)
Hemoglobin: 13.7 g/dL (ref 12.0–15.0)
MCH: 30.4 pg (ref 26.0–34.0)
MCHC: 33.1 g/dL (ref 30.0–36.0)
MCV: 92 fL (ref 78.0–100.0)
Platelets: 275 10*3/uL (ref 150–400)
RBC: 4.5 MIL/uL (ref 3.87–5.11)
RDW: 11.9 % (ref 11.5–15.5)
WBC: 4 10*3/uL (ref 4.0–10.5)

## 2016-07-15 LAB — VITAMIN B12: VITAMIN B 12: 297 pg/mL (ref 180–914)

## 2016-07-15 LAB — LIPID PANEL
CHOL/HDL RATIO: 3.6 ratio
Cholesterol: 240 mg/dL — ABNORMAL HIGH (ref 0–200)
HDL: 67 mg/dL (ref >40)
LDL Cholesterol: 158 mg/dL — ABNORMAL HIGH (ref 0–99)
Triglycerides: 73 mg/dL (ref <150)
VLDL: 15 mg/dL (ref 0–40)

## 2016-07-15 LAB — HIV ANTIBODY (ROUTINE TESTING W REFLEX): HIV Screen 4th Generation wRfx: NONREACTIVE

## 2016-07-16 LAB — HEPATITIS C ANTIBODY: HCV Ab: <0.1 s/co ratio (ref 0.0–0.9)

## 2016-12-31 DIAGNOSIS — H04123 Dry eye syndrome of bilateral lacrimal glands: Secondary | ICD-10-CM | POA: Diagnosis not present

## 2016-12-31 DIAGNOSIS — H25013 Cortical age-related cataract, bilateral: Secondary | ICD-10-CM | POA: Diagnosis not present

## 2016-12-31 DIAGNOSIS — H53022 Refractive amblyopia, left eye: Secondary | ICD-10-CM | POA: Diagnosis not present

## 2016-12-31 DIAGNOSIS — H2513 Age-related nuclear cataract, bilateral: Secondary | ICD-10-CM | POA: Diagnosis not present

## 2017-05-13 ENCOUNTER — Encounter: Payer: Self-pay | Admitting: Internal Medicine

## 2017-05-13 ENCOUNTER — Ambulatory Visit (INDEPENDENT_AMBULATORY_CARE_PROVIDER_SITE_OTHER): Payer: 59 | Admitting: Internal Medicine

## 2017-05-13 DIAGNOSIS — R059 Cough, unspecified: Secondary | ICD-10-CM

## 2017-05-13 DIAGNOSIS — R05 Cough: Secondary | ICD-10-CM | POA: Diagnosis not present

## 2017-05-13 MED ORDER — PREDNISONE 20 MG PO TABS: 40.0000 mg | ORAL_TABLET | Freq: Every day | ORAL | 0 refills | Status: DC

## 2017-05-13 MED ORDER — BENZONATATE 200 MG PO CAPS: 200.0000 mg | ORAL_CAPSULE | Freq: Three times a day (TID) | ORAL | 0 refills | Status: DC | PRN

## 2017-05-13 MED FILL — BENZONATATE 200 MG CAPSULE: 200 | 15 days supply | Qty: 45 | Fill #0

## 2017-05-13 MED FILL — predniSONE 20 MG TABS: 20 | 5 days supply | Qty: 10 | Fill #0

## 2017-05-13 NOTE — Progress Notes (Signed)
   Subjective:    Patient ID: Shelby Calhoun, female    DOB: [birth year], 56 y.o.   MRN: [id number]  HPI The patient is a 56 YO female coming in for cough for about 3 weeks. No fevers or chills. Worse at night. Denies congestion, drainage, ear pain, sinus pressure. Denies sick contacts. Not improving at all. Has taken otc cold medications without much relief. Works in [employer] and sick contacts. Denies much SOB but some limitation of activity due to stamina and breathing.  Review of Systems  Constitutional: Positive for activity change. Negative for appetite change, chills, fatigue, fever and unexpected weight change.  HENT: Positive for sore throat. Negative for congestion, ear discharge, ear pain, postnasal drip, rhinorrhea, sinus pressure, sinus pain, sneezing, tinnitus, trouble swallowing and voice change.   Eyes: Negative.   Respiratory: Positive for cough. Negative for chest tightness, shortness of breath and wheezing.   Cardiovascular: Negative.   Gastrointestinal: Negative.       Objective:   Physical Exam  Constitutional: She is oriented to person, place, and time. She appears well-developed and well-nourished.  HENT:  Head: Normocephalic and atraumatic.  Oropharynx with redness   Eyes: EOM are normal.  Neck: Normal range of motion. No thyromegaly present.  Cardiovascular: Normal rate and regular rhythm.  Pulmonary/Chest: Effort normal and breath sounds normal. No respiratory distress. She has no wheezes. She has no rales.  Some rhonchi bilateral at the bases, clears partially with cough  Abdominal: Soft.  Lymphadenopathy:    She has no cervical adenopathy.  Neurological: She is alert and oriented to person, place, and time.  Skin: Skin is warm and dry.   Vitals:   05/13/17 1530  BP: 118/80  Pulse: 63  Temp: 98.3 F (36.8 C)  TempSrc: Oral  SpO2: 99%  Weight: 162 lb (73.5 kg)  Height: 5\' 5"  (1.651 m)      Assessment & Plan:

## 2017-05-13 NOTE — Patient Instructions (Signed)
We have sent in the prednisone. Take  2 pills daily for 5 days.  We have sent in tessalon perles to use for cough  Up to 3 times per day.

## 2017-05-14 DIAGNOSIS — R05 Cough: Secondary | ICD-10-CM | POA: Insufficient documentation

## 2017-05-14 DIAGNOSIS — R059 Cough, unspecified: Secondary | ICD-10-CM | POA: Insufficient documentation

## 2017-05-14 NOTE — Assessment & Plan Note (Signed)
Rx for prednisone for inflammation and tessalon perles for cough.

## 2017-07-07 DIAGNOSIS — Z6828 Body mass index (BMI) 28.0-28.9, adult: Secondary | ICD-10-CM | POA: Diagnosis not present

## 2017-07-07 DIAGNOSIS — Z1231 Encounter for screening mammogram for malignant neoplasm of breast: Secondary | ICD-10-CM | POA: Diagnosis not present

## 2017-07-07 DIAGNOSIS — Z01419 Encounter for gynecological examination (general) (routine) without abnormal findings: Secondary | ICD-10-CM | POA: Diagnosis not present

## 2017-07-07 LAB — HM MAMMOGRAPHY

## 2017-07-11 ENCOUNTER — Other Ambulatory Visit (HOSPITAL_COMMUNITY): Payer: Self-pay | Admitting: Obstetrics and Gynecology

## 2017-07-11 DIAGNOSIS — Z803 Family history of malignant neoplasm of breast: Secondary | ICD-10-CM

## 2017-07-21 ENCOUNTER — Ambulatory Visit (HOSPITAL_COMMUNITY)
Admission: RE | Admit: 2017-07-21 | Discharge: 2017-07-21 | Disposition: A | Discharge status: Home / Self Care | Payer: 59 | Source: Ambulatory Visit | Attending: Obstetrics and Gynecology | Admitting: Obstetrics and Gynecology

## 2017-07-21 ENCOUNTER — Encounter (HOSPITAL_COMMUNITY): Payer: Self-pay

## 2017-07-25 ENCOUNTER — Ambulatory Visit (HOSPITAL_COMMUNITY)
Admission: RE | Admit: 2017-07-25 | Discharge: 2017-07-25 | Disposition: A | Discharge status: Home / Self Care | Payer: 59 | Source: Ambulatory Visit | Attending: Obstetrics and Gynecology | Admitting: Obstetrics and Gynecology

## 2017-07-25 DIAGNOSIS — N6489 Other specified disorders of breast: Secondary | ICD-10-CM | POA: Diagnosis not present

## 2017-07-25 DIAGNOSIS — Z803 Family history of malignant neoplasm of breast: Secondary | ICD-10-CM | POA: Diagnosis not present

## 2017-07-25 MED ORDER — GADOBENATE DIMEGLUMINE 529 MG/ML IV SOLN
15.0000 mL | Freq: Once | INTRAVENOUS | Status: AC | PRN
  Administered 2017-07-25: 15 mL via INTRAVENOUS

## 2017-11-13 DIAGNOSIS — E538 Deficiency of other specified B group vitamins: Secondary | ICD-10-CM | POA: Insufficient documentation

## 2017-11-13 NOTE — Patient Instructions (Addendum)
Test(s) ordered today. Your results will be released to [facility] (or called to you) after review, usually within 72hours after test completion. If any changes need to be made, you will be notified at that same time.  All other Health Maintenance issues reviewed.   All recommended immunizations and age-appropriate screenings are up-to-date or discussed.  No immunizations administered today.   Medications reviewed and updated.  No changes recommended at this time.   Please followup in one year   Health Maintenance, Female Adopting a healthy lifestyle and getting preventive care can go a long way to promote health and wellness. Talk with your health care provider about what schedule of regular examinations is right for you. This is a good chance for you to check in with your provider about disease prevention and staying healthy. In between checkups, there are plenty of things you can do on your own. Experts have done a lot of research about which lifestyle changes and preventive measures are most likely to keep you healthy. Ask your health care provider for more information. Weight and diet Eat a healthy diet  Be sure to include plenty of vegetables, fruits, low-fat dairy products, and lean protein.  Do not eat a lot of foods high in solid fats, added sugars, or salt.  Get regular exercise. This is one of the most important things you can do for your health. ? Most adults should exercise for at least 150 minutes each week. The exercise should increase your heart rate and make you sweat (moderate-intensity exercise). ? Most adults should also do strengthening exercises at least twice a week. This is in addition to the moderate-intensity exercise.  Maintain a healthy weight  Body mass index (BMI) is a measurement that can be used to identify possible weight problems. It estimates body fat based on height and weight. Your health care provider can help determine your BMI and help you achieve or  maintain a healthy weight.  For females 64 years of age and older: ? A BMI below 18.5 is considered underweight. ? A BMI of 18.5 to 24.9 is normal. ? A BMI of 25 to 29.9 is considered overweight. ? A BMI of 30 and above is considered obese.  Watch levels of cholesterol and blood lipids  You should start having your blood tested for lipids and cholesterol at 57 years of age, then have this test every 5 years.  You may need to have your cholesterol levels checked more often if: ? Your lipid or cholesterol levels are high. ? You are older than 57 years of age. ? You are at high risk for heart disease.  Cancer screening Lung Cancer  Lung cancer screening is recommended for adults 72-7 years old who are at high risk for lung cancer because of a history of smoking.  A yearly low-dose CT scan of the lungs is recommended for people who: ? Currently smoke. ? Have quit within the past 15 years. ? Have at least a 30-pack-year history of smoking. A pack year is smoking an average of one pack of cigarettes a day for 1 year.  Yearly screening should continue until it has been 15 years since you quit.  Yearly screening should stop if you develop a health problem that would prevent you from having lung cancer treatment.  Breast Cancer  Practice breast self-awareness. This means understanding how your breasts normally appear and feel.  It also means doing regular breast self-exams. Let your health care provider know about any changes,  no matter how small.  If you are in your 20s or 30s, you should have a clinical breast exam (CBE) by a health care provider every 1-3 years as part of a regular health exam.  If you are 40 or older, have a CBE every year. Also consider having a breast X-ray (mammogram) every year.  If you have a family history of breast cancer, talk to your health care provider about genetic screening.  If you are at high risk for breast cancer, talk to your health care  provider about having an MRI and a mammogram every year.  Breast cancer gene (BRCA) assessment is recommended for women who have family members with BRCA-related cancers. BRCA-related cancers include: ? Breast. ? Ovarian. ? Tubal. ? Peritoneal cancers.  Results of the assessment will determine the need for genetic counseling and BRCA1 and BRCA2 testing.  Cervical Cancer Your health care provider may recommend that you be screened regularly for cancer of the pelvic organs (ovaries, uterus, and vagina). This screening involves a pelvic examination, including checking for microscopic changes to the surface of your cervix (Pap test). You may be encouraged to have this screening done every 3 years, beginning at age 21.  For women ages 30-65, health care providers may recommend pelvic exams and Pap testing every 3 years, or they may recommend the Pap and pelvic exam, combined with testing for human papilloma virus (HPV), every 5 years. Some types of HPV increase your risk of cervical cancer. Testing for HPV may also be done on women of any age with unclear Pap test results.  Other health care providers may not recommend any screening for nonpregnant women who are considered low risk for pelvic cancer and who do not have symptoms. Ask your health care provider if a screening pelvic exam is right for you.  If you have had past treatment for cervical cancer or a condition that could lead to cancer, you need Pap tests and screening for cancer for at least 20 years after your treatment. If Pap tests have been discontinued, your risk factors (such as having a new sexual partner) need to be reassessed to determine if screening should resume. Some women have medical problems that increase the chance of getting cervical cancer. In these cases, your health care provider may recommend more frequent screening and Pap tests.  Colorectal Cancer  This type of cancer can be detected and often prevented.  Routine  colorectal cancer screening usually begins at 57 years of age and continues through 57 years of age.  Your health care provider may recommend screening at an earlier age if you have risk factors for colon cancer.  Your health care provider may also recommend using home test kits to check for hidden blood in the stool.  A small camera at the end of a tube can be used to examine your colon directly (sigmoidoscopy or colonoscopy). This is done to check for the earliest forms of colorectal cancer.  Routine screening usually begins at age 50.  Direct examination of the colon should be repeated every 5-10 years through 57 years of age. However, you may need to be screened more often if early forms of precancerous polyps or small growths are found.  Skin Cancer  Check your skin from head to toe regularly.  Tell your health care provider about any new moles or changes in moles, especially if there is a change in a mole's shape or color.  Also tell your health care provider if you   have a mole that is larger than the size of a pencil eraser.  Always use sunscreen. Apply sunscreen liberally and repeatedly throughout the day.  Protect yourself by wearing long sleeves, pants, a wide-brimmed hat, and sunglasses whenever you are outside.  Heart disease, diabetes, and high blood pressure  High blood pressure causes heart disease and increases the risk of stroke. High blood pressure is more likely to develop in: ? People who have blood pressure in the high end of the normal range (130-139/85-89 mm Hg). ? People who are overweight or obese. ? People who are African American.  If you are 24-25 years of age, have your blood pressure checked every 3-5 years. If you are 2 years of age or older, have your blood pressure checked every year. You should have your blood pressure measured twice-once when you are at a hospital or clinic, and once when you are not at a hospital or clinic. Record the average of the  two measurements. To check your blood pressure when you are not at a hospital or clinic, you can use: ? An automated blood pressure machine at a pharmacy. ? A home blood pressure monitor.  If you are between 42 years and 59 years old, ask your health care provider if you should take aspirin to prevent strokes.  Have regular diabetes screenings. This involves taking a blood sample to check your fasting blood sugar level. ? If you are at a normal weight and have a low risk for diabetes, have this test once every three years after 57 years of age. ? If you are overweight and have a high risk for diabetes, consider being tested at a younger age or more often. Preventing infection Hepatitis B  If you have a higher risk for hepatitis B, you should be screened for this virus. You are considered at high risk for hepatitis B if: ? You were born in a country where hepatitis B is common. Ask your health care provider which countries are considered high risk. ? Your parents were born in a high-risk country, and you have not been immunized against hepatitis B (hepatitis B vaccine). ? You have HIV or AIDS. ? You use needles to inject street drugs. ? You live with someone who has hepatitis B. ? You have had sex with someone who has hepatitis B. ? You get hemodialysis treatment. ? You take certain medicines for conditions, including cancer, organ transplantation, and autoimmune conditions.  Hepatitis C  Blood testing is recommended for: ? Everyone born from [birth year] through 1965. ? Anyone with known risk factors for hepatitis C.  Sexually transmitted infections (STIs)  You should be screened for sexually transmitted infections (STIs) including gonorrhea and chlamydia if: ? You are sexually active and are younger than 57 years of age. ? You are older than 57 years of age and your health care provider tells you that you are at risk for this type of infection. ? Your sexual activity has changed since you  were last screened and you are at an increased risk for chlamydia or gonorrhea. Ask your health care provider if you are at risk.  If you do not have HIV, but are at risk, it may be recommended that you take a prescription medicine daily to prevent HIV infection. This is called pre-exposure prophylaxis (PrEP). You are considered at risk if: ? You are sexually active and do not regularly use condoms or know the HIV status of your partner(s). ? You take drugs by injection. ?  You are sexually active with a partner who has HIV.  Talk with your health care provider about whether you are at high risk of being infected with HIV. If you choose to begin PrEP, you should first be tested for HIV. You should then be tested every 3 months for as long as you are taking PrEP. Pregnancy  If you are premenopausal and you may become pregnant, ask your health care provider about preconception counseling.  If you may become pregnant, take 400 to 800 micrograms (mcg) of folic acid every day.  If you want to prevent pregnancy, talk to your health care provider about birth control (contraception). Osteoporosis and menopause  Osteoporosis is a disease in which the bones lose minerals and strength with aging. This can result in serious bone fractures. Your risk for osteoporosis can be identified using a bone density scan.  If you are 65 years of age or older, or if you are at risk for osteoporosis and fractures, ask your health care provider if you should be screened.  Ask your health care provider whether you should take a calcium or vitamin D supplement to lower your risk for osteoporosis.  Menopause may have certain physical symptoms and risks.  Hormone replacement therapy may reduce some of these symptoms and risks. Talk to your health care provider about whether hormone replacement therapy is right for you. Follow these instructions at home:  Schedule regular health, dental, and eye exams.  Stay current  with your immunizations.  Do not use any tobacco products including cigarettes, chewing tobacco, or electronic cigarettes.  If you are pregnant, do not drink alcohol.  If you are breastfeeding, limit how much and how often you drink alcohol.  Limit alcohol intake to no more than 1 drink per day for nonpregnant women. One drink equals 12 ounces of beer, 5 ounces of wine, or 1 ounces of hard liquor.  Do not use street drugs.  Do not share needles.  Ask your health care provider for help if you need support or information about quitting drugs.  Tell your health care provider if you often feel depressed.  Tell your health care provider if you have ever been abused or do not feel safe at home. This information is not intended to replace advice given to you by your health care provider. Make sure you discuss any questions you have with your health care provider. Document Released: 12/17/2010 Document Revised: 11/09/2015 Document Reviewed: 03/07/2015 Elsevier Interactive Patient Education  2018 [employer]  

## 2017-11-13 NOTE — Assessment & Plan Note (Addendum)
Check lipid panel  Not on any medication Regular exercise and healthy diet encouraged  

## 2017-11-13 NOTE — Progress Notes (Signed)
Subjective:    Patient ID: Shelby Calhoun, female    DOB: 1961-01-17, 57 y.o.   MRN: [id number]  HPI She is here for a physical exam.   Since returning on vacation one week ago she has had a cough.  It is worse at night.  She thinks it is getting better.  She denies fever, SOB and wheeze.    She has occ dysphagia when eating.  It takes a while to go away once it happens.  Only occurs when she is eating.  Coming more frequently.  No gerd.  Feels like it is mid esophagus.  Can't drink while the episode is occurring.     Medications and allergies reviewed with patient and updated if appropriate.  Patient Active Problem List   Diagnosis Date Noted  . B12 deficiency 11/13/2017  . Hyperlipidemia 03/29/2010    No current outpatient medications on file prior to visit.   No current facility-administered medications on file prior to visit.     Past Medical History:  Diagnosis Date  . DJD (degenerative joint disease) of hip    Dr Maureen Ralphs  . Hyperlipidemia     Past Surgical History:  Procedure Laterality Date  . BUNIONECTOMY     Dr Blenda Mounts  . COLONOSCOPY  2013   negative; Dr Carlean Purl  . G 3 P 1    . TONSILLECTOMY AND ADENOIDECTOMY    . 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 Marital status: Married    Spouse name: Not on file  . Number of children: Not on file  . Years of education: Not on file  . Highest education level: Not on file  Occupational History  . Not on file  Social Needs  . Financial resource strain: Not on file  . Food insecurity:    Worry: Not on file    Inability: Not on file  . Transportation needs:    Medical: Not on file    Non-medical: Not on file  Tobacco Use  . Smoking status: Never Smoker  . Smokeless tobacco: Never Used  Substance and Sexual Activity  .  Alcohol use: Yes    Alcohol/week: 2.4 oz    Types: 4 Glasses of wine per week    Comment:    . Drug use: No  . Sexual activity: Yes  Lifestyle  . Physical activity:    Days per week: Not on file    Minutes per session: Not on file  . Stress: Not on file  Relationships  . Social connections:    Talks on phone: Not on file    Gets together: Not on file    Attends religious service: Not on file    Active member of club or organization: Not on file    Attends meetings of clubs or organizations: Not on file    Relationship status: Not on file  Other Topics Concern  . Not on file  Social History Narrative  . Not on file    Family History  Problem Relation Age of Onset  . Stroke Mother 30  . Hypertension Mother   . Hyperlipidemia Mother   . Osteoporosis Mother   . Heart attack Father 63  . Breast cancer Sister   . Hyperlipidemia Sister   . Hypertension Sister   . Diabetes  Sister   . Cancer Maternal Grandfather         intraabdominal    Review of Systems  Constitutional: Negative for chills and fever.  Eyes: Negative for visual disturbance.  Respiratory: Positive for cough (minimal sputum). Negative for shortness of breath and wheezing.   Cardiovascular: Negative for chest pain, palpitations and leg swelling.  Gastrointestinal: Negative for abdominal pain, blood in stool, constipation, diarrhea and nausea.  Genitourinary: Negative for dysuria and hematuria.  Musculoskeletal: Positive for arthralgias ([place]). Negative for back pain.  Skin: Negative for color change and rash.  Neurological: Positive for headaches (occ). Negative for light-headedness.  Psychiatric/Behavioral: Negative for dysphoric mood and sleep disturbance. The patient is not nervous/anxious.        Objective:   Vitals:   11/14/17 1404  BP: 110/68  Pulse: 64  Resp: 16  Temp: 98.3 F (36.8 C)  SpO2: 98%   Filed Weights   11/14/17 1404  Weight: 164 lb (74.4 kg)   Body mass index is 27.29  kg/m.  Wt Readings from Last 3 Encounters:  11/14/17 164 lb (74.4 kg)  05/13/17 162 lb (73.5 kg)  07/08/16 161 lb (73 kg)     Physical Exam Constitutional: She appears well-developed and well-nourished. No distress.  HENT:  Head: Normocephalic and atraumatic.  Right Ear: External ear normal. Normal ear canal and TM Left Ear: External ear normal.  Normal ear canal and TM Mouth/Throat: Oropharynx is clear and moist.  Eyes: Conjunctivae and EOM are normal.  Neck: Neck supple. No tracheal deviation present. No thyromegaly present.  No carotid bruit  Cardiovascular: Normal rate, regular rhythm and normal heart sounds.   No murmur heard.  No edema. Pulmonary/Chest: Effort normal and breath sounds normal. No respiratory distress. She has no wheezes. She has no rales.  Breast: deferred to Gyn Abdominal: Soft. She exhibits no distension. There is no tenderness.  Lymphadenopathy: She has no cervical adenopathy.  Skin: Skin is warm and dry. She is not diaphoretic.  Psychiatric: She has a normal mood and affect. Her behavior is normal.        Assessment & Plan:   Physical exam: Screening blood work  ordered Immunizations had shingrix, others up to date Colonoscopy   Up to date  Mammogram  Up to date  Gyn   Dr Radene Knee, Up to date  Dexa  Up to date  Eye exams   Up to date  - 12/2016 EKG  Last done 12/2011 Exercise  Walking - not daily, used to do pilate's - will try to make exercise more regular Weight   Would like to lose weight Skin   No concerns Substance abuse  none  See Problem List for Assessment and Plan of chronic medical problems.   FU in one year

## 2017-11-14 ENCOUNTER — Ambulatory Visit (INDEPENDENT_AMBULATORY_CARE_PROVIDER_SITE_OTHER): Payer: 59 | Admitting: Internal Medicine

## 2017-11-14 ENCOUNTER — Ambulatory Visit (INDEPENDENT_AMBULATORY_CARE_PROVIDER_SITE_OTHER)
Admission: RE | Admit: 2017-11-14 | Discharge: 2017-11-14 | Disposition: A | Discharge status: Home / Self Care | Payer: 59 | Source: Ambulatory Visit | Attending: Internal Medicine | Admitting: Internal Medicine

## 2017-11-14 ENCOUNTER — Other Ambulatory Visit (INDEPENDENT_AMBULATORY_CARE_PROVIDER_SITE_OTHER): Payer: 59

## 2017-11-14 ENCOUNTER — Encounter: Payer: Self-pay | Admitting: Internal Medicine

## 2017-11-14 VITALS — BP 110/68 | HR 64 | Temp 98.3°F | Resp 16 | Ht 65.0 in | Wt 164.0 lb

## 2017-11-14 DIAGNOSIS — E538 Deficiency of other specified B group vitamins: Secondary | ICD-10-CM

## 2017-11-14 DIAGNOSIS — Z0000 Encounter for general adult medical examination without abnormal findings: Secondary | ICD-10-CM

## 2017-11-14 DIAGNOSIS — R05 Cough: Secondary | ICD-10-CM | POA: Diagnosis not present

## 2017-11-14 DIAGNOSIS — R1310 Dysphagia, unspecified: Secondary | ICD-10-CM

## 2017-11-14 DIAGNOSIS — R059 Cough, unspecified: Secondary | ICD-10-CM

## 2017-11-14 DIAGNOSIS — E782 Mixed hyperlipidemia: Secondary | ICD-10-CM

## 2017-11-14 LAB — CBC WITH DIFFERENTIAL/PLATELET
BASOS ABS: 0 10*3/uL (ref 0.0–0.1)
Basophils Relative: 0.7 % (ref 0.0–3.0)
Eosinophils Absolute: 0.2 10*3/uL (ref 0.0–0.7)
Eosinophils Relative: 3.4 % (ref 0.0–5.0)
HEMATOCRIT: 38.7 % (ref 36.0–46.0)
HEMOGLOBIN: 12.9 g/dL (ref 12.0–15.0)
LYMPHS PCT: 24.1 % (ref 12.0–46.0)
Lymphs Abs: 1.3 10*3/uL (ref 0.7–4.0)
MCHC: 33.4 g/dL (ref 30.0–36.0)
MCV: 92.1 fl (ref 78.0–100.0)
MONOS PCT: 6.9 % (ref 3.0–12.0)
Monocytes Absolute: 0.4 10*3/uL (ref 0.1–1.0)
NEUTROS ABS: 3.4 10*3/uL (ref 1.4–7.7)
Neutrophils Relative %: 64.9 % (ref 43.0–77.0)
PLATELETS: 288 10*3/uL (ref 150.0–400.0)
RBC: 4.2 Mil/uL (ref 3.87–5.11)
RDW: 12.1 % (ref 11.5–15.5)
WBC: 5.3 10*3/uL (ref 4.0–10.5)

## 2017-11-14 LAB — COMPREHENSIVE METABOLIC PANEL
ALT: 22 U/L (ref 0–35)
AST: 17 U/L (ref 0–37)
Albumin: 4.4 g/dL (ref 3.5–5.2)
Alkaline Phosphatase: 75 U/L (ref 39–117)
BILIRUBIN TOTAL: 0.3 mg/dL (ref 0.2–1.2)
BUN: 16 mg/dL (ref 6–23)
CALCIUM: 9.3 mg/dL (ref 8.4–10.5)
CO2: 28 meq/L (ref 19–32)
Chloride: 105 mEq/L (ref 96–112)
Creatinine, Ser: 0.64 mg/dL (ref 0.40–1.20)
GFR: 101.85 mL/min (ref >60.00)
Glucose, Bld: 95 mg/dL (ref 70–99)
Potassium: 4.2 mEq/L (ref 3.5–5.1)
Sodium: 139 mEq/L (ref 135–145)
Total Protein: 7.1 g/dL (ref 6.0–8.3)

## 2017-11-14 LAB — VITAMIN B12: VITAMIN B 12: 286 pg/mL (ref 211–911)

## 2017-11-14 LAB — LIPID PANEL
CHOL/HDL RATIO: 4
Cholesterol: 217 mg/dL — ABNORMAL HIGH (ref 0–200)
HDL: 60.5 mg/dL (ref >39.00)
LDL CALC: 138 mg/dL — AB (ref 0–99)
NonHDL: 156.52
TRIGLYCERIDES: 94 mg/dL (ref 0.0–149.0)
VLDL: 18.8 mg/dL (ref 0.0–40.0)

## 2017-11-14 LAB — TSH: TSH: 2.48 u[IU]/mL (ref 0.35–4.50)

## 2017-11-15 ENCOUNTER — Encounter: Payer: Self-pay | Admitting: Internal Medicine

## 2017-11-15 DIAGNOSIS — R1310 Dysphagia, unspecified: Secondary | ICD-10-CM | POA: Insufficient documentation

## 2017-11-15 NOTE — Assessment & Plan Note (Signed)
For past week Likely viral cxr today

## 2017-11-15 NOTE — Assessment & Plan Note (Signed)
Occurs intermittently with eating food only No GERD Will monitor for now - if continues will require further eval with swallowing eval or GI referral

## 2017-11-15 NOTE — Assessment & Plan Note (Signed)
Not taking any supplementation Will check level

## 2017-11-19 DIAGNOSIS — H04321 Acute dacryocystitis of right lacrimal passage: Secondary | ICD-10-CM | POA: Diagnosis not present

## 2017-11-19 MED FILL — NEO/POLYMYXIN/DEXAMETH DROP: 3.5-10000-0 | 25 days supply | Qty: 5 | Fill #0

## 2017-11-19 MED FILL — AMOX-CLAV 875-125 MG TABLET: 875-125 | 10 days supply | Qty: 20 | Fill #0

## 2017-11-19 MED FILL — NEO/POLY/DEXAMET EYE OINT: 3.5-10000-0 | 7 days supply | Qty: 4 | Fill #0

## 2017-11-24 DIAGNOSIS — H04321 Acute dacryocystitis of right lacrimal passage: Secondary | ICD-10-CM | POA: Diagnosis not present

## 2017-11-27 ENCOUNTER — Encounter: Payer: Self-pay | Admitting: Internal Medicine

## 2018-06-05 ENCOUNTER — Ambulatory Visit (INDEPENDENT_AMBULATORY_CARE_PROVIDER_SITE_OTHER): Payer: Self-pay | Admitting: Nurse Practitioner

## 2018-06-05 VITALS — BP 115/70 | HR 69 | Temp 98.4°F | Resp 16 | Wt 165.0 lb

## 2018-06-05 DIAGNOSIS — B029 Zoster without complications: Secondary | ICD-10-CM

## 2018-06-05 MED ORDER — VALACYCLOVIR HCL 1 G PO TABS: 1000.0000 mg | ORAL_TABLET | Freq: Three times a day (TID) | ORAL | 0 refills | Status: AC

## 2018-06-05 MED FILL — valACYclovir HCL 1 GM TABS: 1 | 7 days supply | Qty: 21 | Fill #0

## 2018-06-05 NOTE — Patient Instructions (Signed)
Shingles -Take medication as prescribed. -Do not scratch the affected area. -Keep area clean and dry. -Keep area covered when at work to prevent spread.  -Avoid contact with the elderly, immunocompromised, pregnant women while symptoms persist. -Follow up as needed.   Shingles, which is also known as herpes zoster, is an infection that causes a painful skin rash and fluid-filled blisters. It is caused by a virus. Shingles only develops in people who:  Have had chickenpox.  Have been given a medicine to protect against chickenpox (have been vaccinated). Shingles is rare in this group. What are the causes? Shingles is caused by varicella-zoster virus (VZV). This is the same virus that causes chickenpox. After a person is exposed to VZV, the virus stays in the body in an inactive (dormant) state. Shingles develops if the virus is reactivated. This can happen many years after the first (initial) exposure to VZV. It is not known what causes this virus to be reactivated. What increases the risk? People who have had chickenpox or received the chickenpox vaccine are at risk for shingles. Shingles infection is more common in people who:  Are older than age 11.  Have a weakened disease-fighting system (immune system), such as people with: ? HIV. ? AIDS. ? Cancer.  Are taking medicines that weaken the immune system, such as transplant medicines.  Are experiencing a lot of stress. What are the signs or symptoms? Early symptoms of this condition include itching, tingling, and pain in an area on your skin. Pain may be described as burning, stabbing, or throbbing. A few days or weeks after early symptoms start, a painful red rash appears. The rash is usually on one side of the body and has a band-like or belt-like pattern. The rash eventually turns into fluid-filled blisters that break open, change into scabs, and dry up in about 2-3 weeks. At any time during the infection, you may also  develop:  A fever.  Chills.  A headache.  An upset stomach. How is this diagnosed? This condition is diagnosed with a skin exam. Skin or fluid samples may be taken from the blisters before a diagnosis is made. These samples are examined under a microscope or sent to a lab for testing. How is this treated? The rash may last for several weeks. There is not a specific cure for this condition. Your health care provider will probably prescribe medicines to help you manage pain, recover more quickly, and avoid long-term problems. Medicines may include:  Antiviral drugs.  Anti-inflammatory drugs.  Pain medicines.  Anti-itching medicines (antihistamines). If the area involved is on your face, you may be referred to a specialist, such as an eye doctor (ophthalmologist) or an ear, nose, and throat (ENT) doctor (otolaryngologist) to help you avoid eye problems, chronic pain, or disability. Follow these instructions at home: Medicines  Take over-the-counter and prescription medicines only as told by your health care provider.  Apply an anti-itch cream or numbing cream to the affected area as told by your health care provider. Relieving itching and discomfort   Apply cold, wet cloths (cold compresses) to the area of the rash or blisters as told by your health care provider.  Cool baths can be soothing. Try adding baking soda or dry oatmeal to the water to reduce itching. Do not bathe in hot water. Blister and rash care  Keep your rash covered with a loose bandage (dressing). Wear loose-fitting clothing to help ease the pain of material rubbing against the rash.  Keep your  rash and blisters clean by washing the area with mild soap and cool water as told by your health care provider.  Check your rash every day for signs of infection. Check for: ? More redness, swelling, or pain. ? Fluid or blood. ? Warmth. ? Pus or a bad smell.  Do not scratch your rash or pick at your blisters. To  help avoid scratching: ? Keep your fingernails clean and cut short. ? Wear gloves or mittens while you sleep, if scratching is a problem. General instructions  Rest as told by your health care provider.  Keep all follow-up visits as told by your health care provider. This is important.  Wash your hands often with soap and water. If soap and water are not available, use hand sanitizer. Doing this lowers your chance of getting a bacterial skin infection.  Before your blisters change into scabs, your shingles infection can cause chickenpox in people who have never had it or have never been vaccinated against it. To prevent this from happening, avoid contact with other people, especially: ? Babies. ? Pregnant women. ? Children who have eczema. ? Elderly people who have transplants. ? People who have chronic illnesses, such as cancer or AIDS. Contact a health care provider if:  Your pain is not relieved with prescribed medicines.  Your pain does not get better after the rash heals.  You have signs of infection in the rash area, such as: ? More redness, swelling, or pain around the rash. ? Fluid or blood coming from the rash. ? The rash area feeling warm to the touch. ? Pus or a bad smell coming from the rash. Get help right away if:  The rash is on your face or nose.  You have facial pain, pain around your eye area, or loss of feeling on one side of your face.  You have difficulty seeing.  You have ear pain or have ringing in your ear.  You have a loss of taste.  Your condition gets worse. Summary  Shingles, which is also known as herpes zoster, is an infection that causes a painful skin rash and fluid-filled blisters.  This condition is diagnosed with a skin exam. Skin or fluid samples may be taken from the blisters and examined before the diagnosis is made.  Keep your rash covered with a loose bandage (dressing). Wear loose-fitting clothing to help ease the pain of  material rubbing against the rash.  Before your blisters change into scabs, your shingles infection can cause chickenpox in people who have never had it or have never been vaccinated against it. This information is not intended to replace advice given to you by your health care provider. Make sure you discuss any questions you have with your health care provider. Document Released: 06/03/2005 Document Revised: 02/05/2017 Document Reviewed: 02/05/2017 Elsevier Interactive Patient Education  2019 [employer].

## 2018-06-05 NOTE — Progress Notes (Addendum)
Subjective:     Shelby Calhoun is a 57 y.o. female who presents for evaluation of a rash involving the right torso. Rash started 5 days ago. Lesions are clear, and blistering in texture. Rash has changed over time. Rash is pruritic. Associated symptoms: back pain.  The patient states she experienced back pain starting 5 days ago which relieved itself after 3 days, at which time she then noticed the rash to her right torso.  Patient initially thought back pain was related to a pulled muscle. ` Patient denies: abdominal pain, arthralgia, congestion, cough, fever, sore throat and vomiting. Patient denies pain or itching at this time. Patient has not had contacts with similar rash. Patient has not had new exposures (soaps, lotions, laundry detergents, foods, medications, plants, insects or animals).  Patient states she does have a history of chickenpox and has had the shingles vaccine.  Patient does not believe she has come in contact with anyone with same/similar rash.  The following portions of the patient's history were reviewed and updated as appropriate: allergies, current medications and past medical history.  Review of Systems Constitutional: negative Eyes: negative Ears, nose, mouth, throat, and face: negative Respiratory: negative Cardiovascular: negative Gastrointestinal: negative Integument/breast: positive for pruritus, rash and skin color change, negative for dryness and skin lesion(s)    Objective:    BP 115/70 (BP Location: Right Arm, Patient Position: Sitting, Cuff Size: Normal)   Pulse 69   Temp 98.4 F (36.9 C) (Oral)   Resp 16   Wt 165 lb (74.8 kg)   LMP 10/30/2012   SpO2 97%   BMI 27.46 kg/m  General:  alert, cooperative and no distress  Skin:  Vesicular rash to the right torso with erythematous base, no oozing or drainage at present, linear configuration, irregular borders, measures approximately 3cm x 3cm          Assessment:    Herpes Zoster  Uncomplicated   Plan:   Exam findings, diagnosis etiology and medication use and indications reviewed with patient. Follow- Up and discharge instructions provided. No emergent/urgent issues found on exam.  Based on the patient's clinical presentation and physical assessment, this is clearly a case of herpes zoster uncomplicated.  Patient was prescribed valacyclovir for symptoms.  Informed patient to follow-up with her PCP if symptoms do not improve or worsen.  Patient education was provided. Patient verbalized understanding of information provided and agrees with plan of care (POC), all questions answered. The patient is advised to call or return to clinic if condition does not see an improvement in symptoms, or to seek the care of the closest emergency department if condition worsens with the above plan.   1. Herpes zoster without complication  - valACYclovir (VALTREX) 1000 MG tablet; Take 1 tablet (1,000 mg total) by mouth 3 (three) times daily for 7 days.  Dispense: 21 tablet; Refill: 0 -Take medication as prescribed. -Do not scratch the affected area. -Keep area clean and dry. -Keep area covered when at work to prevent spread.  -Avoid contact with the elderly, immunocompromised, pregnant women while symptoms persist. -Follow up as needed.

## 2018-07-23 DIAGNOSIS — Z01419 Encounter for gynecological examination (general) (routine) without abnormal findings: Secondary | ICD-10-CM | POA: Diagnosis not present

## 2018-07-23 DIAGNOSIS — Z6827 Body mass index (BMI) 27.0-27.9, adult: Secondary | ICD-10-CM | POA: Diagnosis not present

## 2018-07-23 DIAGNOSIS — Z1231 Encounter for screening mammogram for malignant neoplasm of breast: Secondary | ICD-10-CM | POA: Diagnosis not present

## 2018-07-29 ENCOUNTER — Encounter: Payer: Self-pay | Admitting: Internal Medicine

## 2018-09-30 ENCOUNTER — Encounter: Payer: Self-pay | Admitting: Internal Medicine

## 2018-10-02 ENCOUNTER — Encounter: Payer: 59 | Admitting: Internal Medicine

## 2018-11-16 ENCOUNTER — Encounter: Payer: Self-pay | Admitting: Internal Medicine

## 2018-11-16 DIAGNOSIS — E782 Mixed hyperlipidemia: Secondary | ICD-10-CM

## 2018-11-16 DIAGNOSIS — E538 Deficiency of other specified B group vitamins: Secondary | ICD-10-CM

## 2018-11-16 DIAGNOSIS — Z0000 Encounter for general adult medical examination without abnormal findings: Secondary | ICD-10-CM

## 2018-11-17 NOTE — Patient Instructions (Addendum)
All other Health Maintenance issues reviewed.   All recommended immunizations and age-appropriate screenings are up-to-date or discussed.  No immunizations administered today.   Medications reviewed and updated.  Changes include :   none    Please followup in one year    Health Maintenance, Female Adopting a healthy lifestyle and getting preventive care can go a long way to promote health and wellness. Talk with your health care provider about what schedule of regular examinations is right for you. This is a good chance for you to check in with your provider about disease prevention and staying healthy. In between checkups, there are plenty of things you can do on your own. Experts have done a lot of research about which lifestyle changes and preventive measures are most likely to keep you healthy. Ask your health care provider for more information. Weight and diet Eat a healthy diet  Be sure to include plenty of vegetables, fruits, low-fat dairy products, and lean protein.  Do not eat a lot of foods high in solid fats, added sugars, or salt.  Get regular exercise. This is one of the most important things you can do for your health. ? Most adults should exercise for at least 150 minutes each week. The exercise should increase your heart rate and make you sweat (moderate-intensity exercise). ? Most adults should also do strengthening exercises at least twice a week. This is in addition to the moderate-intensity exercise. Maintain a healthy weight  Body mass index (BMI) is a measurement that can be used to identify possible weight problems. It estimates body fat based on height and weight. Your health care provider can help determine your BMI and help you achieve or maintain a healthy weight.  For females 4 years of age and older: ? A BMI below 18.5 is considered underweight. ? A BMI of 18.5 to 24.9 is normal. ? A BMI of 25 to 29.9 is considered overweight. ? A BMI of 30 and above  is considered obese. Watch levels of cholesterol and blood lipids  You should start having your blood tested for lipids and cholesterol at 58 years of age, then have this test every 5 years.  You may need to have your cholesterol levels checked more often if: ? Your lipid or cholesterol levels are high. ? You are older than 58 years of age. ? You are at high risk for heart disease. Cancer screening Lung Cancer  Lung cancer screening is recommended for adults 38-18 years old who are at high risk for lung cancer because of a history of smoking.  A yearly low-dose CT scan of the lungs is recommended for people who: ? Currently smoke. ? Have quit within the past 15 years. ? Have at least a 30-pack-year history of smoking. A pack year is smoking an average of one pack of cigarettes a day for 1 year.  Yearly screening should continue until it has been 15 years since you quit.  Yearly screening should stop if you develop a health problem that would prevent you from having lung cancer treatment. Breast Cancer  Practice breast self-awareness. This means understanding how your breasts normally appear and feel.  It also means doing regular breast self-exams. Let your health care provider know about any changes, no matter how small.  If you are in your 20s or 30s, you should have a clinical breast exam (CBE) by a health care provider every 1-3 years as part of a regular health exam.  If you are  77 or older, have a CBE every year. Also consider having a breast X-ray (mammogram) every year.  If you have a family history of breast cancer, talk to your health care provider about genetic screening.  If you are at high risk for breast cancer, talk to your health care provider about having an MRI and a mammogram every year.  Breast cancer gene (BRCA) assessment is recommended for women who have family members with BRCA-related cancers. BRCA-related cancers  include: ? Breast. ? Ovarian. ? Tubal. ? Peritoneal cancers.  Results of the assessment will determine the need for genetic counseling and BRCA1 and BRCA2 testing. Cervical Cancer Your health care provider may recommend that you be screened regularly for cancer of the pelvic organs (ovaries, uterus, and vagina). This screening involves a pelvic examination, including checking for microscopic changes to the surface of your cervix (Pap test). You may be encouraged to have this screening done every 3 years, beginning at age 49.  For women ages 53-65, health care providers may recommend pelvic exams and Pap testing every 3 years, or they may recommend the Pap and pelvic exam, combined with testing for human papilloma virus (HPV), every 5 years. Some types of HPV increase your risk of cervical cancer. Testing for HPV may also be done on women of any age with unclear Pap test results.  Other health care providers may not recommend any screening for nonpregnant women who are considered low risk for pelvic cancer and who do not have symptoms. Ask your health care provider if a screening pelvic exam is right for you.  If you have had past treatment for cervical cancer or a condition that could lead to cancer, you need Pap tests and screening for cancer for at least 20 years after your treatment. If Pap tests have been discontinued, your risk factors (such as having a new sexual partner) need to be reassessed to determine if screening should resume. Some women have medical problems that increase the chance of getting cervical cancer. In these cases, your health care provider may recommend more frequent screening and Pap tests. Colorectal Cancer  This type of cancer can be detected and often prevented.  Routine colorectal cancer screening usually begins at 58 years of age and continues through 58 years of age.  Your health care provider may recommend screening at an earlier age if you have risk factors for  colon cancer.  Your health care provider may also recommend using home test kits to check for hidden blood in the stool.  A small camera at the end of a tube can be used to examine your colon directly (sigmoidoscopy or colonoscopy). This is done to check for the earliest forms of colorectal cancer.  Routine screening usually begins at age 18.  Direct examination of the colon should be repeated every 5-10 years through 58 years of age. However, you may need to be screened more often if early forms of precancerous polyps or small growths are found. Skin Cancer  Check your skin from head to toe regularly.  Tell your health care provider about any new moles or changes in moles, especially if there is a change in a mole's shape or color.  Also tell your health care provider if you have a mole that is larger than the size of a pencil eraser.  Always use sunscreen. Apply sunscreen liberally and repeatedly throughout the day.  Protect yourself by wearing long sleeves, pants, a wide-brimmed hat, and sunglasses whenever you are outside. Heart  disease, diabetes, and high blood pressure  High blood pressure causes heart disease and increases the risk of stroke. High blood pressure is more likely to develop in: ? People who have blood pressure in the high end of the normal range (130-139/85-89 mm Hg). ? People who are overweight or obese. ? People who are African American.  If you are 98-31 years of age, have your blood pressure checked every 3-5 years. If you are 51 years of age or older, have your blood pressure checked every year. You should have your blood pressure measured twice-once when you are at a hospital or clinic, and once when you are not at a hospital or clinic. Record the average of the two measurements. To check your blood pressure when you are not at a hospital or clinic, you can use: ? An automated blood pressure machine at a pharmacy. ? A home blood pressure monitor.  If you are  between 76 years and 3 years old, ask your health care provider if you should take aspirin to prevent strokes.  Have regular diabetes screenings. This involves taking a blood sample to check your fasting blood sugar level. ? If you are at a normal weight and have a low risk for diabetes, have this test once every three years after 58 years of age. ? If you are overweight and have a high risk for diabetes, consider being tested at a younger age or more often. Preventing infection Hepatitis B  If you have a higher risk for hepatitis B, you should be screened for this virus. You are considered at high risk for hepatitis B if: ? You were born in a country where hepatitis B is common. Ask your health care provider which countries are considered high risk. ? Your parents were born in a high-risk country, and you have not been immunized against hepatitis B (hepatitis B vaccine). ? You have HIV or AIDS. ? You use needles to inject street drugs. ? You live with someone who has hepatitis B. ? You have had sex with someone who has hepatitis B. ? You get hemodialysis treatment. ? You take certain medicines for conditions, including cancer, organ transplantation, and autoimmune conditions. Hepatitis C  Blood testing is recommended for: ? Everyone born from [birth year] through 1965. ? Anyone with known risk factors for hepatitis C. Sexually transmitted infections (STIs)  You should be screened for sexually transmitted infections (STIs) including gonorrhea and chlamydia if: ? You are sexually active and are younger than 58 years of age. ? You are older than 58 years of age and your health care provider tells you that you are at risk for this type of infection. ? Your sexual activity has changed since you were last screened and you are at an increased risk for chlamydia or gonorrhea. Ask your health care provider if you are at risk.  If you do not have HIV, but are at risk, it may be recommended that you take  a prescription medicine daily to prevent HIV infection. This is called pre-exposure prophylaxis (PrEP). You are considered at risk if: ? You are sexually active and do not regularly use condoms or know the HIV status of your partner(s). ? You take drugs by injection. ? You are sexually active with a partner who has HIV. Talk with your health care provider about whether you are at high risk of being infected with HIV. If you choose to begin PrEP, you should first be tested for HIV. You should then be  tested every 3 months for as long as you are taking PrEP. Pregnancy  If you are premenopausal and you may become pregnant, ask your health care provider about preconception counseling.  If you may become pregnant, take 400 to 800 micrograms (mcg) of folic acid every day.  If you want to prevent pregnancy, talk to your health care provider about birth control (contraception). Osteoporosis and menopause  Osteoporosis is a disease in which the bones lose minerals and strength with aging. This can result in serious bone fractures. Your risk for osteoporosis can be identified using a bone density scan.  If you are 61 years of age or older, or if you are at risk for osteoporosis and fractures, ask your health care provider if you should be screened.  Ask your health care provider whether you should take a calcium or vitamin D supplement to lower your risk for osteoporosis.  Menopause may have certain physical symptoms and risks.  Hormone replacement therapy may reduce some of these symptoms and risks. Talk to your health care provider about whether hormone replacement therapy is right for you. Follow these instructions at home:  Schedule regular health, dental, and eye exams.  Stay current with your immunizations.  Do not use any tobacco products including cigarettes, chewing tobacco, or electronic cigarettes.  If you are pregnant, do not drink alcohol.  If you are breastfeeding, limit how  much and how often you drink alcohol.  Limit alcohol intake to no more than 1 drink per day for nonpregnant women. One drink equals 12 ounces of beer, 5 ounces of wine, or 1 ounces of hard liquor.  Do not use street drugs.  Do not share needles.  Ask your health care provider for help if you need support or information about quitting drugs.  Tell your health care provider if you often feel depressed.  Tell your health care provider if you have ever been abused or do not feel safe at home. This information is not intended to replace advice given to you by your health care provider. Make sure you discuss any questions you have with your health care provider. Document Released: 12/17/2010 Document Revised: 11/09/2015 Document Reviewed: 03/07/2015 Elsevier Interactive Patient Education  2019 [employer].

## 2018-11-17 NOTE — Progress Notes (Signed)
Subjective:    Patient ID: Shelby Calhoun, female    DOB: October 08, 1960, 58 y.o.   MRN: [id number]  HPI She is here for a physical exam.   She denies any changes in her health since she was here last.  She has no concerns.  She has changed jobs the beginning of the year.   Medications and allergies reviewed with patient and updated if appropriate.  Patient Active Problem List   Diagnosis Date Noted  . Dysphagia 11/15/2017  . B12 deficiency 11/13/2017  . Cough 05/14/2017  . Hyperlipidemia 03/29/2010    Current Outpatient Medications on File Prior to Visit  Medication Sig Dispense Refill  . Cyanocobalamin (VITAMIN B12) 1000 MCG TBCR Take by mouth.    Marland Kitchen glucosamine-chondroitin 500-400 MG tablet Take by mouth.     No current facility-administered medications on file prior to visit.     Past Medical History:  Diagnosis Date  . DJD (degenerative joint disease) of hip    Dr Maureen Ralphs  . Hyperlipidemia     Past Surgical History:  Procedure Laterality Date  . BUNIONECTOMY     Dr Blenda Mounts  . COLONOSCOPY  2013   negative; Dr Carlean Purl  . G 3 P 1    . TONSILLECTOMY AND ADENOIDECTOMY    . 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 Marital status: Married    Spouse name: Not on file  . Number of children: Not on file  . Years of education: Not on file  . Highest education level: Not on file  Occupational History  . Not on file  Social Needs  . Financial resource strain: Not on file  . Food insecurity:    Worry: Not on file    Inability: Not on file  . Transportation needs:    Medical: Not on file    Non-medical: Not on file  Tobacco Use  . Smoking status: Never Smoker  . Smokeless tobacco: Never Used  Substance and Sexual Activity  . Alcohol use: Yes    Alcohol/week: 4.0 standard  drinks    Types: 4 Glasses of wine per week    Comment:    . Drug use: No  . Sexual activity: Yes  Lifestyle  . Physical activity:    Days per week: Not on file    Minutes per session: Not on file  . Stress: Not on file  Relationships  . Social connections:    Talks on phone: Not on file    Gets together: Not on file    Attends religious service: Not on file    Active member of club or organization: Not on file    Attends meetings of clubs or organizations: Not on file    Relationship status: Not on file  Other Topics Concern  . Not on file  Social History Narrative  . Not on file    Family History  Problem Relation Age of Onset  . Stroke Mother 29  . Hypertension Mother   . Hyperlipidemia Mother   . Osteoporosis Mother   . Heart attack Father 45  . Breast cancer Sister   . Hyperlipidemia Sister   . Hypertension Sister   . Diabetes Sister   . Cancer Maternal Grandfather         intraabdominal  Review of Systems  Constitutional: Negative for chills and fever.  Eyes: Negative for visual disturbance.  Respiratory: Negative for cough, shortness of breath and wheezing.   Cardiovascular: Negative for chest pain, palpitations and leg swelling.  Genitourinary: Negative for dysuria and hematuria.  Musculoskeletal: Negative for arthralgias and back pain.  Skin: Negative for color change and rash.  Neurological: Negative for dizziness, light-headedness and headaches.  Psychiatric/Behavioral: Negative for dysphoric mood. The patient is not nervous/anxious.        Objective:   Vitals:   11/18/18 1426  BP: 112/68  Pulse: 65  Resp: 16  Temp: 98.4 F (36.9 C)  SpO2: 97%   Filed Weights   11/18/18 1426  Weight: 156 lb (70.8 kg)   Body mass index is 25.96 kg/m.  BP Readings from Last 3 Encounters:  11/18/18 112/68  06/05/18 115/70  11/14/17 110/68    Wt Readings from Last 3 Encounters:  11/18/18 156 lb (70.8 kg)  06/05/18 165 lb (74.8 kg)  11/14/17 164  lb (74.4 kg)     Physical Exam Constitutional: She appears well-developed and well-nourished. No distress.  HENT:  Head: Normocephalic and atraumatic.  Right Ear: External ear normal. Normal ear canal and TM Left Ear: External ear normal.  Normal ear canal and TM Mouth/Throat: Oropharynx is clear and moist.  Eyes: Conjunctivae and EOM are normal.  Neck: Neck supple. No tracheal deviation present. No thyromegaly present.  No carotid bruit  Cardiovascular: Normal rate, regular rhythm and normal heart sounds.  No murmur heard.  No edema. Pulmonary/Chest: Effort normal and breath sounds normal. No respiratory distress. She has no wheezes. She has no rales.  Breast: deferred   Abdominal: Soft. She exhibits no distension. There is no tenderness.  Lymphadenopathy: She has no cervical adenopathy.  Skin: Skin is warm and dry. She is not diaphoretic.  Psychiatric: She has a normal mood and affect. Her behavior is normal.        Assessment & Plan:   Physical exam: Screening blood work ordered Immunizations   Had varicella, others up to date Colonoscopy  Up to date  Mammogram   Up to date  Gyn   Up to date  Eye exams   Up to date  Exercise    walking Weight normal BMI Skin     no skin concerns Substance abuse  none  See Problem List for Assessment and Plan of chronic medical problems.   Follow-up annually

## 2018-11-18 ENCOUNTER — Ambulatory Visit (INDEPENDENT_AMBULATORY_CARE_PROVIDER_SITE_OTHER): Payer: 59 | Admitting: Internal Medicine

## 2018-11-18 ENCOUNTER — Other Ambulatory Visit (INDEPENDENT_AMBULATORY_CARE_PROVIDER_SITE_OTHER): Payer: 59

## 2018-11-18 ENCOUNTER — Other Ambulatory Visit: Payer: Self-pay

## 2018-11-18 ENCOUNTER — Encounter: Payer: Self-pay | Admitting: Internal Medicine

## 2018-11-18 VITALS — BP 112/68 | HR 65 | Temp 98.4°F | Resp 16 | Ht 65.0 in | Wt 156.0 lb

## 2018-11-18 DIAGNOSIS — E538 Deficiency of other specified B group vitamins: Secondary | ICD-10-CM

## 2018-11-18 DIAGNOSIS — Z0000 Encounter for general adult medical examination without abnormal findings: Secondary | ICD-10-CM

## 2018-11-18 DIAGNOSIS — R7303 Prediabetes: Secondary | ICD-10-CM | POA: Diagnosis not present

## 2018-11-18 DIAGNOSIS — E782 Mixed hyperlipidemia: Secondary | ICD-10-CM

## 2018-11-18 LAB — CBC WITH DIFFERENTIAL/PLATELET
Basophils Absolute: 0.1 10*3/uL (ref 0.0–0.1)
Basophils Relative: 1.2 % (ref 0.0–3.0)
Eosinophils Absolute: 0.2 10*3/uL (ref 0.0–0.7)
Eosinophils Relative: 4.8 % (ref 0.0–5.0)
HCT: 41 % (ref 36.0–46.0)
Hemoglobin: 14 g/dL (ref 12.0–15.0)
Lymphocytes Relative: 29.8 % (ref 12.0–46.0)
Lymphs Abs: 1.4 10*3/uL (ref 0.7–4.0)
MCHC: 34.2 g/dL (ref 30.0–36.0)
MCV: 92.4 fl (ref 78.0–100.0)
Monocytes Absolute: 0.4 10*3/uL (ref 0.1–1.0)
Monocytes Relative: 8 % (ref 3.0–12.0)
Neutro Abs: 2.7 10*3/uL (ref 1.4–7.7)
Neutrophils Relative %: 56.2 % (ref 43.0–77.0)
Platelets: 289 10*3/uL (ref 150.0–400.0)
RBC: 4.44 Mil/uL (ref 3.87–5.11)
RDW: 12.6 % (ref 11.5–15.5)
WBC: 4.8 10*3/uL (ref 4.0–10.5)

## 2018-11-18 LAB — COMPREHENSIVE METABOLIC PANEL
ALT: 18 U/L (ref 0–35)
AST: 16 U/L (ref 0–37)
Albumin: 4.4 g/dL (ref 3.5–5.2)
Alkaline Phosphatase: 78 U/L (ref 39–117)
BUN: 15 mg/dL (ref 6–23)
CO2: 26 mEq/L (ref 19–32)
Calcium: 9 mg/dL (ref 8.4–10.5)
Chloride: 104 mEq/L (ref 96–112)
Creatinine, Ser: 0.73 mg/dL (ref 0.40–1.20)
GFR: 82.03 mL/min (ref >60.00)
Glucose, Bld: 94 mg/dL (ref 70–99)
Potassium: 3.9 mEq/L (ref 3.5–5.1)
Sodium: 139 mEq/L (ref 135–145)
Total Bilirubin: 0.5 mg/dL (ref 0.2–1.2)
Total Protein: 7 g/dL (ref 6.0–8.3)

## 2018-11-18 LAB — LIPID PANEL
Cholesterol: 242 mg/dL — ABNORMAL HIGH (ref 0–200)
HDL: 69.6 mg/dL (ref >39.00)
LDL Cholesterol: 153 mg/dL — ABNORMAL HIGH (ref 0–99)
NonHDL: 172.72
Total CHOL/HDL Ratio: 3
Triglycerides: 99 mg/dL (ref 0.0–149.0)
VLDL: 19.8 mg/dL (ref 0.0–40.0)

## 2018-11-18 LAB — HEMOGLOBIN A1C: Hgb A1c MFr Bld: 6 % (ref 4.6–6.5)

## 2018-11-18 LAB — VITAMIN B12: Vitamin B-12: 1233 pg/mL — ABNORMAL HIGH (ref 211–911)

## 2018-11-18 LAB — TSH: TSH: 4.35 u[IU]/mL (ref 0.35–4.50)

## 2018-11-18 NOTE — Assessment & Plan Note (Signed)
A1c 6.0% Discussed continuing regular exercise and decreasing sugar/carbs We will monitor annually

## 2018-11-18 NOTE — Assessment & Plan Note (Signed)
Cholesterol elevated Overall low risk and no need for medication at this time Will work on lifestyle  The 10-year ASCVD risk score Mikey Bussing DC Brooke Bonito., et al., 2013) is: 1.8%   Values used to calculate the score:     Age: 58 years     Sex: Female     Is Non-Hispanic African American: No     Diabetic: No     Tobacco smoker: No     Systolic Blood Pressure: 346 mmHg     Is BP treated: No     HDL Cholesterol: 69.6 mg/dL     Total Cholesterol: 242 mg/dL

## 2018-11-18 NOTE — Assessment & Plan Note (Signed)
Taking B12 daily Level is very good-continue daily supplementation

## 2019-03-27 ENCOUNTER — Encounter: Payer: Self-pay | Admitting: Internal Medicine

## 2019-09-14 ENCOUNTER — Encounter: Payer: Self-pay | Admitting: Internal Medicine

## 2019-09-15 DIAGNOSIS — Z9189 Other specified personal risk factors, not elsewhere classified: Secondary | ICD-10-CM | POA: Diagnosis not present

## 2019-09-15 DIAGNOSIS — B009 Herpesviral infection, unspecified: Secondary | ICD-10-CM | POA: Insufficient documentation

## 2019-09-15 DIAGNOSIS — Z6827 Body mass index (BMI) 27.0-27.9, adult: Secondary | ICD-10-CM | POA: Diagnosis not present

## 2019-09-15 DIAGNOSIS — Z01419 Encounter for gynecological examination (general) (routine) without abnormal findings: Secondary | ICD-10-CM | POA: Diagnosis not present

## 2019-10-21 DIAGNOSIS — Z1231 Encounter for screening mammogram for malignant neoplasm of breast: Secondary | ICD-10-CM | POA: Diagnosis not present

## 2019-10-21 DIAGNOSIS — Z13820 Encounter for screening for osteoporosis: Secondary | ICD-10-CM | POA: Diagnosis not present

## 2019-10-25 ENCOUNTER — Other Ambulatory Visit: Payer: Self-pay | Admitting: Obstetrics and Gynecology

## 2019-10-25 DIAGNOSIS — R928 Other abnormal and inconclusive findings on diagnostic imaging of breast: Secondary | ICD-10-CM

## 2019-10-29 ENCOUNTER — Ambulatory Visit
Admission: RE | Admit: 2019-10-29 | Discharge: 2019-10-29 | Disposition: A | Discharge status: Home / Self Care | Payer: 59 | Source: Ambulatory Visit | Attending: Obstetrics and Gynecology | Admitting: Obstetrics and Gynecology

## 2019-10-29 ENCOUNTER — Other Ambulatory Visit: Payer: Self-pay

## 2019-10-29 ENCOUNTER — Ambulatory Visit: Payer: 59

## 2019-10-29 DIAGNOSIS — R922 Inconclusive mammogram: Secondary | ICD-10-CM | POA: Diagnosis not present

## 2019-10-29 DIAGNOSIS — R928 Other abnormal and inconclusive findings on diagnostic imaging of breast: Secondary | ICD-10-CM

## 2019-10-29 LAB — HM MAMMOGRAPHY

## 2019-11-02 ENCOUNTER — Other Ambulatory Visit (HOSPITAL_COMMUNITY): Payer: Self-pay | Admitting: Obstetrics and Gynecology

## 2019-11-02 ENCOUNTER — Other Ambulatory Visit: Payer: Self-pay | Admitting: Obstetrics and Gynecology

## 2019-11-02 DIAGNOSIS — Z9189 Other specified personal risk factors, not elsewhere classified: Secondary | ICD-10-CM

## 2020-07-11 ENCOUNTER — Other Ambulatory Visit (HOSPITAL_COMMUNITY): Payer: Self-pay

## 2020-07-11 MED FILL — IBUPROFEN 600 MG TABLET: 600 | 4 days supply | Qty: 16 | Fill #0

## 2020-07-11 MED FILL — HYDROCODON-APAP 5-325: 5-325 | 2 days supply | Qty: 10 | Fill #0

## 2020-07-11 MED FILL — AMOXICILLIN 500 MG CAPSULE: 500 | 4 days supply | Qty: 25 | Fill #0

## 2020-07-11 MED FILL — CHLORHEXIDINE 0.12% RINSE: 0.12 | 16 days supply | Qty: 473 | Fill #0

## 2020-07-22 ENCOUNTER — Encounter: Payer: Self-pay | Admitting: Internal Medicine

## 2020-08-03 ENCOUNTER — Encounter: Payer: Self-pay | Admitting: Internal Medicine

## 2020-08-03 NOTE — Progress Notes (Signed)
Outside notes received. Information abstracted. Notes sent to scan.  

## 2020-09-30 DIAGNOSIS — Z20822 Contact with and (suspected) exposure to covid-19: Secondary | ICD-10-CM | POA: Diagnosis not present

## 2020-11-22 DIAGNOSIS — Z6827 Body mass index (BMI) 27.0-27.9, adult: Secondary | ICD-10-CM | POA: Diagnosis not present

## 2020-11-22 DIAGNOSIS — Z01419 Encounter for gynecological examination (general) (routine) without abnormal findings: Secondary | ICD-10-CM | POA: Diagnosis not present

## 2020-11-22 DIAGNOSIS — Z1231 Encounter for screening mammogram for malignant neoplasm of breast: Secondary | ICD-10-CM | POA: Diagnosis not present

## 2020-11-23 ENCOUNTER — Other Ambulatory Visit: Payer: Self-pay | Admitting: Obstetrics and Gynecology

## 2020-11-23 ENCOUNTER — Other Ambulatory Visit (HOSPITAL_COMMUNITY): Payer: Self-pay | Admitting: Obstetrics and Gynecology

## 2020-11-23 DIAGNOSIS — Z803 Family history of malignant neoplasm of breast: Secondary | ICD-10-CM

## 2020-12-05 ENCOUNTER — Ambulatory Visit (HOSPITAL_COMMUNITY): Payer: 59

## 2020-12-05 ENCOUNTER — Encounter (HOSPITAL_COMMUNITY): Payer: Self-pay

## 2021-01-25 ENCOUNTER — Other Ambulatory Visit: Payer: Self-pay | Admitting: Obstetrics and Gynecology

## 2021-01-25 DIAGNOSIS — Z803 Family history of malignant neoplasm of breast: Secondary | ICD-10-CM

## 2021-02-13 ENCOUNTER — Other Ambulatory Visit: Payer: Self-pay

## 2021-02-13 ENCOUNTER — Ambulatory Visit
Admission: RE | Admit: 2021-02-13 | Discharge: 2021-02-13 | Disposition: A | Discharge status: Home / Self Care | Payer: 59 | Source: Ambulatory Visit | Attending: Obstetrics and Gynecology | Admitting: Obstetrics and Gynecology

## 2021-02-13 DIAGNOSIS — Z803 Family history of malignant neoplasm of breast: Secondary | ICD-10-CM

## 2021-02-13 DIAGNOSIS — Z1239 Encounter for other screening for malignant neoplasm of breast: Secondary | ICD-10-CM | POA: Diagnosis not present

## 2021-02-13 MED ORDER — GADOBUTROL 1 MMOL/ML IV SOLN
7.0000 mL | Freq: Once | INTRAVENOUS | Status: AC | PRN
  Administered 2021-02-13: 7 mL via INTRAVENOUS

## 2021-03-20 ENCOUNTER — Other Ambulatory Visit: Payer: Self-pay

## 2021-10-09 ENCOUNTER — Encounter: Payer: Self-pay | Admitting: Internal Medicine

## 2021-10-09 NOTE — Patient Instructions (Addendum)
? ? ? ?Blood work was ordered.   ? ? ?Medications changes include :  none  ? ? ? ?Return in about 1 year (around 10/12/2022) for Physical Exam. ? ? ?Health Maintenance, Female ?Adopting a healthy lifestyle and getting preventive care are important in promoting health and wellness. Ask your health care provider about: ?The right schedule for you to have regular tests and exams. ?Things you can do on your own to prevent diseases and keep yourself healthy. ?What should I know about diet, weight, and exercise? ?Eat a healthy diet ? ?Eat a diet that includes plenty of vegetables, fruits, low-fat dairy products, and lean protein. ?Do not eat a lot of foods that are high in solid fats, added sugars, or sodium. ?Maintain a healthy weight ?Body mass index (BMI) is used to identify weight problems. It estimates body fat based on height and weight. Your health care provider can help determine your BMI and help you achieve or maintain a healthy weight. ?Get regular exercise ?Get regular exercise. This is one of the most important things you can do for your health. Most adults should: ?Exercise for at least 150 minutes each week. The exercise should increase your heart rate and make you sweat (moderate-intensity exercise). ?Do strengthening exercises at least twice a week. This is in addition to the moderate-intensity exercise. ?Spend less time sitting. Even light physical activity can be beneficial. ?Watch cholesterol and blood lipids ?Have your blood tested for lipids and cholesterol at 61 years of age, then have this test every 5 years. ?Have your cholesterol levels checked more often if: ?Your lipid or cholesterol levels are high. ?You are older than 61 years of age. ?You are at high risk for heart disease. ?What should I know about cancer screening? ?Depending on your health history and family history, you may need to have cancer screening at various ages. This may include screening for: ?Breast cancer. ?Cervical  cancer. ?Colorectal cancer. ?Skin cancer. ?Lung cancer. ?What should I know about heart disease, diabetes, and high blood pressure? ?Blood pressure and heart disease ?High blood pressure causes heart disease and increases the risk of stroke. This is more likely to develop in people who have high blood pressure readings or are overweight. ?Have your blood pressure checked: ?Every 3-5 years if you are 7-38 years of age. ?Every year if you are 49 years old or older. ?Diabetes ?Have regular diabetes screenings. This checks your fasting blood sugar level. Have the screening done: ?Once every three years after age 79 if you are at a normal weight and have a low risk for diabetes. ?More often and at a younger age if you are overweight or have a high risk for diabetes. ?What should I know about preventing infection? ?Hepatitis B ?If you have a higher risk for hepatitis B, you should be screened for this virus. Talk with your health care provider to find out if you are at risk for hepatitis B infection. ?Hepatitis C ?Testing is recommended for: ?Everyone born from [birth year] through 1965. ?Anyone with known risk factors for hepatitis C. ?Sexually transmitted infections (STIs) ?Get screened for STIs, including gonorrhea and chlamydia, if: ?You are sexually active and are younger than 61 years of age. ?You are older than 61 years of age and your health care provider tells you that you are at risk for this type of infection. ?Your sexual activity has changed since you were last screened, and you are at increased risk for chlamydia or gonorrhea. Ask your health care  provider if you are at risk. ?Ask your health care provider about whether you are at high risk for HIV. Your health care provider may recommend a prescription medicine to help prevent HIV infection. If you choose to take medicine to prevent HIV, you should first get tested for HIV. You should then be tested every 3 months for as long as you are taking the  medicine. ?Pregnancy ?If you are about to stop having your period (premenopausal) and you may become pregnant, seek counseling before you get pregnant. ?Take 400 to 800 micrograms (mcg) of folic acid every day if you become pregnant. ?Ask for birth control (contraception) if you want to prevent pregnancy. ?Osteoporosis and menopause ?Osteoporosis is a disease in which the bones lose minerals and strength with aging. This can result in bone fractures. If you are 2 years old or older, or if you are at risk for osteoporosis and fractures, ask your health care provider if you should: ?Be screened for bone loss. ?Take a calcium or vitamin D supplement to lower your risk of fractures. ?Be given hormone replacement therapy (HRT) to treat symptoms of menopause. ?Follow these instructions at home: ?Alcohol use ?Do not drink alcohol if: ?Your health care provider tells you not to drink. ?You are pregnant, may be pregnant, or are planning to become pregnant. ?If you drink alcohol: ?Limit how much you have to: ?0-1 drink a day. ?Know how much alcohol is in your drink. In the [place] one drink equals one 12 oz bottle of beer (355 mL), one 5 oz glass of wine (148 mL), or one 1? oz glass of hard liquor (44 mL). ?Lifestyle ?Do not use any products that contain nicotine or tobacco. These products include cigarettes, chewing tobacco, and vaping devices, such as e-cigarettes. If you need help quitting, ask your health care provider. ?Do not use street drugs. ?Do not share needles. ?Ask your health care provider for help if you need support or information about quitting drugs. ?General instructions ?Schedule regular health, dental, and eye exams. ?Stay current with your vaccines. ?Tell your health care provider if: ?You often feel depressed. ?You have ever been abused or do not feel safe at home. ?Summary ?Adopting a healthy lifestyle and getting preventive care are important in promoting health and wellness. ?Follow your health care  provider's instructions about healthy diet, exercising, and getting tested or screened for diseases. ?Follow your health care provider's instructions on monitoring your cholesterol and blood pressure. ?This information is not intended to replace advice given to you by your health care provider. Make sure you discuss any questions you have with your health care provider. ?Document Revised: 10/23/2020 Document Reviewed: 10/23/2020 ?Elsevier Patient Education ? [place]. ? ?

## 2021-10-09 NOTE — Progress Notes (Signed)
? ? ?Subjective:  ? ? Patient ID: Shelby Calhoun, female    DOB: Aug 29, 1960, 61 y.o.   MRN: [id number] ? ? ?This visit occurred during the SARS-CoV-2 public health emergency.  Safety protocols were in place, including screening questions prior to the visit, additional usage of staff PPE, and extensive cleaning of exam room while observing appropriate contact time as indicated for disinfecting solutions. ? ? ? ?HPI ?Shelby Calhoun is here for  ?Chief Complaint  ?Patient presents with  ? Annual Exam  ? ?Right middle finger PIP joint with increasing stiffness, pain, swelling.  Can not bend fingers as times.  Sister has the same thing.  Remainder of finger joints are good.  ? ? ? ?Medications and allergies reviewed with patient and updated if appropriate. ? ? ? ?Current Outpatient Medications on File Prior to Visit  ?Medication Sig Dispense Refill  ? Cyanocobalamin (VITAMIN B12) 1000 MCG TBCR Take by mouth.    ? glucosamine-chondroitin 500-400 MG tablet Take by mouth.    ? VITAMIN D, CHOLECALCIFEROL, PO Take 2,000 Units by mouth daily.    ? ?No current facility-administered medications on file prior to visit.  ? ? ?Review of Systems  ?Constitutional:  Negative for fever.  ?Eyes:  Negative for visual disturbance.  ?Respiratory:  Negative for cough, shortness of breath and wheezing.   ?Cardiovascular:  Negative for chest pain, palpitations and leg swelling.  ?Gastrointestinal:  Negative for abdominal pain, blood in stool, constipation, diarrhea and nausea.  ?Genitourinary:  Negative for dysuria.  ?Musculoskeletal:  Positive for arthralgias (right middle PIP, mild joint stiffness in am). Negative for back pain.  ?Skin:  Negative for rash.  ?Neurological:  Positive for headaches (occ). Negative for dizziness, light-headedness and numbness.  ?Psychiatric/Behavioral:  Negative for dysphoric mood. The patient is not nervous/anxious.   ? ?   ?Objective:  ? ?Vitals:  ? 10/11/21 0749  ?BP: 108/68  ?Pulse: 60  ?Temp: 97.9 ?F (36.6 ?C)   ?SpO2: 97%  ? ?Filed Weights  ? 10/11/21 0749  ?Weight: 154 lb 12.8 oz (70.2 kg)  ? ?Body mass index is 25.76 kg/m?. ? ?BP Readings from Last 3 Encounters:  ?10/11/21 108/68  ?11/18/18 112/68  ?06/05/18 115/70  ? ? ?Wt Readings from Last 3 Encounters:  ?10/11/21 154 lb 12.8 oz (70.2 kg)  ?11/18/18 156 lb (70.8 kg)  ?06/05/18 165 lb (74.8 kg)  ? ? ? ?  11/18/2018  ?  2:26 PM 11/14/2017  ?  2:05 PM  ?Depression screen PHQ 2/9  ?Decreased Interest 0 0  ?Down, Depressed, Hopeless 0 0  ?PHQ - 2 Score 0 0  ? ? ? ?   ? View : No data to display.  ?  ?  ?  ? ? ? ? ?  ?Physical Exam ?Constitutional: She appears well-developed and well-nourished. No distress.  ?HENT:  ?Head: Normocephalic and atraumatic.  ?Right Ear: External ear normal. Normal ear canal and TM ?Left Ear: External ear normal.  Normal ear canal and TM ?Mouth/Throat: Oropharynx is clear and moist.  ?Eyes: Conjunctivae and EOM are normal.  ?Neck: Neck supple. No tracheal deviation present. No thyromegaly present.  ?No carotid bruit  ?Cardiovascular: Normal rate, regular rhythm and normal heart sounds.   ?No murmur heard.  No edema. ?Pulmonary/Chest: Effort normal and breath sounds normal. No respiratory distress. She has no wheezes. She has no rales.  ?Breast: deferred   ?Abdominal: Soft. She exhibits no distension. There is no tenderness.  ?Lymphadenopathy: She has no cervical  adenopathy.  ?Skin: Skin is warm and dry. She is not diaphoretic.  ?Psychiatric: She has a normal mood and affect. Her behavior is normal.  ? ? ? ?Lab Results  ?Component Value Date  ? WBC 4.8 11/18/2018  ? HGB 14.0 11/18/2018  ? HCT 41.0 11/18/2018  ? PLT 289.0 11/18/2018  ? GLUCOSE 94 11/18/2018  ? CHOL 242 (H) 11/18/2018  ? TRIG 99.0 11/18/2018  ? HDL 69.60 11/18/2018  ? LDLCALC 153 (H) 11/18/2018  ? ALT 18 11/18/2018  ? AST 16 11/18/2018  ? NA 139 11/18/2018  ? K 3.9 11/18/2018  ? CL 104 11/18/2018  ? CREATININE 0.73 11/18/2018  ? BUN 15 11/18/2018  ? CO2 26 11/18/2018  ? TSH 4.35  11/18/2018  ? INR 0.96 04/23/2012  ? HGBA1C 6.0 11/18/2018  ? ? ? ? ?   ?Assessment & Plan:  ? ?Physical exam: ?Screening blood work  ordered ?Exercise  walking ?Weight  good ?Substance abuse  none ? ? ?Reviewed recommended immunizations. ? ? ?Health Maintenance  ?Topic Date Due  ? Zoster Vaccines- Shingrix (1 of 2) Never done  ? COVID-19 Vaccine (4 - Booster for [place] series) 10/27/2021 (Originally 09/08/2020)  ? MAMMOGRAM  10/28/2021  ? TETANUS/TDAP  12/15/2021  ? INFLUENZA VACCINE  01/15/2022  ? COLONOSCOPY (Pts 45-107yr Insurance coverage will need to be confirmed)  02/05/2022  ? PAP SMEAR-Modifier  07/07/2022  ? Hepatitis C Screening  Completed  ? HIV Screening  Completed  ? HPV VACCINES  Aged Out  ?  ? ? ? ? ? ? ?See Problem List for Assessment and Plan of chronic medical problems. ? ? ? ? ?

## 2021-10-11 ENCOUNTER — Ambulatory Visit (INDEPENDENT_AMBULATORY_CARE_PROVIDER_SITE_OTHER): Payer: 59 | Admitting: Internal Medicine

## 2021-10-11 ENCOUNTER — Encounter: Payer: Self-pay | Admitting: Internal Medicine

## 2021-10-11 VITALS — BP 108/68 | HR 60 | Temp 97.9°F | Ht 65.0 in | Wt 154.8 lb

## 2021-10-11 DIAGNOSIS — E538 Deficiency of other specified B group vitamins: Secondary | ICD-10-CM | POA: Diagnosis not present

## 2021-10-11 DIAGNOSIS — R7303 Prediabetes: Secondary | ICD-10-CM

## 2021-10-11 DIAGNOSIS — E782 Mixed hyperlipidemia: Secondary | ICD-10-CM

## 2021-10-11 DIAGNOSIS — Z0000 Encounter for general adult medical examination without abnormal findings: Secondary | ICD-10-CM

## 2021-10-11 DIAGNOSIS — R1310 Dysphagia, unspecified: Secondary | ICD-10-CM | POA: Diagnosis not present

## 2021-10-11 LAB — COMPREHENSIVE METABOLIC PANEL
ALT: 17 U/L (ref 0–35)
AST: 20 U/L (ref 0–37)
Albumin: 4.4 g/dL (ref 3.5–5.2)
Alkaline Phosphatase: 84 U/L (ref 39–117)
BUN: 12 mg/dL (ref 6–23)
CO2: 25 mEq/L (ref 19–32)
Calcium: 9 mg/dL (ref 8.4–10.5)
Chloride: 104 mEq/L (ref 96–112)
Creatinine, Ser: 0.69 mg/dL (ref 0.40–1.20)
GFR: 94.35 mL/min (ref >60.00)
Glucose, Bld: 103 mg/dL — ABNORMAL HIGH (ref 70–99)
Potassium: 4 mEq/L (ref 3.5–5.1)
Sodium: 138 mEq/L (ref 135–145)
Total Bilirubin: 0.5 mg/dL (ref 0.2–1.2)
Total Protein: 7 g/dL (ref 6.0–8.3)

## 2021-10-11 LAB — LIPID PANEL
Cholesterol: 234 mg/dL — ABNORMAL HIGH (ref 0–200)
HDL: 73.4 mg/dL (ref >39.00)
LDL Cholesterol: 142 mg/dL — ABNORMAL HIGH (ref 0–99)
NonHDL: 160.71
Total CHOL/HDL Ratio: 3
Triglycerides: 95 mg/dL (ref 0.0–149.0)
VLDL: 19 mg/dL (ref 0.0–40.0)

## 2021-10-11 LAB — CBC WITH DIFFERENTIAL/PLATELET
Basophils Absolute: 0 10*3/uL (ref 0.0–0.1)
Basophils Relative: 1.2 % (ref 0.0–3.0)
Eosinophils Absolute: 0.2 10*3/uL (ref 0.0–0.7)
Eosinophils Relative: 4.4 % (ref 0.0–5.0)
HCT: 33.3 % — ABNORMAL LOW (ref 36.0–46.0)
Hemoglobin: 11.2 g/dL — ABNORMAL LOW (ref 12.0–15.0)
Lymphocytes Relative: 29 % (ref 12.0–46.0)
Lymphs Abs: 1.1 10*3/uL (ref 0.7–4.0)
MCHC: 33.7 g/dL (ref 30.0–36.0)
MCV: 88.7 fl (ref 78.0–100.0)
Monocytes Absolute: 0.4 10*3/uL (ref 0.1–1.0)
Monocytes Relative: 10.4 % (ref 3.0–12.0)
Neutro Abs: 2 10*3/uL (ref 1.4–7.7)
Neutrophils Relative %: 55 % (ref 43.0–77.0)
Platelets: 366 10*3/uL (ref 150.0–400.0)
RBC: 3.76 Mil/uL — ABNORMAL LOW (ref 3.87–5.11)
RDW: 13.3 % (ref 11.5–15.5)
WBC: 3.6 10*3/uL — ABNORMAL LOW (ref 4.0–10.5)

## 2021-10-11 LAB — TSH: TSH: 3.23 u[IU]/mL (ref 0.35–5.50)

## 2021-10-11 LAB — VITAMIN B12: Vitamin B-12: 1060 pg/mL — ABNORMAL HIGH (ref 211–911)

## 2021-10-11 LAB — HEMOGLOBIN A1C: Hgb A1c MFr Bld: 5.8 % (ref 4.6–6.5)

## 2021-10-11 NOTE — Assessment & Plan Note (Signed)
Occasional  ?With cauliflower and tuna - no other foods ?Will discuss with GI  ?

## 2021-10-11 NOTE — Assessment & Plan Note (Signed)
Chronic ?Regular exercise and healthy diet encouraged ?Check lipid panel, CMP, TSH ?Diet controlled ?

## 2021-10-11 NOTE — Assessment & Plan Note (Signed)
Chronic Check a1c Low sugar / carb diet Stressed regular exercise  

## 2021-10-11 NOTE — Assessment & Plan Note (Signed)
Chronic ?Taking B12 regularly ?Check B12 level ?

## 2021-10-18 ENCOUNTER — Encounter: Payer: Self-pay | Admitting: Internal Medicine

## 2021-10-23 NOTE — Telephone Encounter (Signed)
Varicella has been entered.Marland KitchenJohny Chess ?

## 2021-10-25 ENCOUNTER — Encounter: Payer: Self-pay | Admitting: Internal Medicine

## 2021-12-10 ENCOUNTER — Encounter: Payer: Self-pay | Admitting: Internal Medicine

## 2021-12-11 ENCOUNTER — Ambulatory Visit: Payer: 59 | Admitting: Internal Medicine

## 2021-12-11 ENCOUNTER — Encounter: Payer: Self-pay | Admitting: Internal Medicine

## 2021-12-11 ENCOUNTER — Telehealth: Payer: Self-pay | Admitting: Internal Medicine

## 2021-12-11 ENCOUNTER — Other Ambulatory Visit (HOSPITAL_COMMUNITY): Payer: Self-pay

## 2021-12-11 VITALS — BP 110/70 | HR 83 | Temp 98.3°F | Ht 65.0 in | Wt 151.8 lb

## 2021-12-11 DIAGNOSIS — J019 Acute sinusitis, unspecified: Secondary | ICD-10-CM | POA: Diagnosis not present

## 2021-12-11 DIAGNOSIS — R7303 Prediabetes: Secondary | ICD-10-CM | POA: Diagnosis not present

## 2021-12-11 MED ORDER — AMOXICILLIN-POT CLAVULANATE 875-125 MG PO TABS
1.0000 | ORAL_TABLET | Freq: Two times a day (BID) | ORAL | 0 refills | Status: DC
  Filled 2021-12-11: qty 20, 10d supply, fill #0

## 2021-12-11 NOTE — Assessment & Plan Note (Signed)
Lab Results  Component Value Date   HGBA1C 5.8 10/11/2021   Stable, pt to continue current medical treatment  - diet, wt control

## 2021-12-11 NOTE — Assessment & Plan Note (Signed)
Mild to mod, for antibx course - augmentin bid x 10 days,  to f/u any worsening symptoms or concerns

## 2022-01-08 ENCOUNTER — Ambulatory Visit: Payer: 59 | Admitting: Internal Medicine

## 2022-01-30 DIAGNOSIS — Z130 Encounter for screening for diseases of the blood and blood-forming organs and certain disorders involving the immune mechanism: Secondary | ICD-10-CM | POA: Diagnosis not present

## 2022-01-30 DIAGNOSIS — Z13820 Encounter for screening for osteoporosis: Secondary | ICD-10-CM | POA: Diagnosis not present

## 2022-01-30 DIAGNOSIS — Z01419 Encounter for gynecological examination (general) (routine) without abnormal findings: Secondary | ICD-10-CM | POA: Diagnosis not present

## 2022-01-30 DIAGNOSIS — Z1231 Encounter for screening mammogram for malignant neoplasm of breast: Secondary | ICD-10-CM | POA: Diagnosis not present

## 2022-01-30 DIAGNOSIS — Z6826 Body mass index (BMI) 26.0-26.9, adult: Secondary | ICD-10-CM | POA: Diagnosis not present

## 2022-02-05 LAB — HM MAMMOGRAPHY

## 2022-02-05 LAB — HM DEXA SCAN

## 2022-02-05 LAB — HM PAP SMEAR

## 2022-02-06 ENCOUNTER — Other Ambulatory Visit: Payer: Self-pay | Admitting: Internal Medicine

## 2022-02-07 ENCOUNTER — Encounter: Payer: Self-pay | Admitting: Internal Medicine

## 2022-02-07 NOTE — Progress Notes (Signed)
Outside notes received. Information abstracted. Notes sent to scan.  

## 2022-02-11 ENCOUNTER — Ambulatory Visit: Payer: 59 | Admitting: Internal Medicine

## 2022-02-14 ENCOUNTER — Encounter: Payer: Self-pay | Admitting: Internal Medicine

## 2022-02-14 DIAGNOSIS — D649 Anemia, unspecified: Secondary | ICD-10-CM | POA: Insufficient documentation

## 2022-02-14 NOTE — Progress Notes (Signed)
    Subjective:    Patient ID: Shelby Calhoun, female    DOB: 02-Dec-1960, 61 y.o.   MRN: [id number]      HPI Shelby Calhoun is here for  Chief Complaint  Patient presents with   Anemia    Patient saw GYN on 8/16 (labs done)    Saw her Gyn and her hgb was around 10 by fingerstick -repeat was about the same with blood work. She was advised to follow up here for further evaluation.    Not a vegetarian - eats a little bit of everything.  Taking a MVI and B12 daily.    Denies any abnormal bleeding or bruising.    Medications and allergies reviewed with patient and updated if appropriate.  Current Outpatient Medications on File Prior to Visit  Medication Sig Dispense Refill   Cyanocobalamin (VITAMIN B12) 1000 MCG TBCR Take by mouth.     glucosamine-chondroitin 500-400 MG tablet Take by mouth.     VITAMIN D, CHOLECALCIFEROL, PO Take 2,000 Units by mouth daily.     No current facility-administered medications on file prior to visit.    Review of Systems  Constitutional:  Negative for fatigue and fever.  Respiratory:  Negative for shortness of breath.   Cardiovascular:  Negative for chest pain and palpitations.  Gastrointestinal:  Negative for abdominal pain, blood in stool (no black stool), constipation, diarrhea and nausea.       No gerd  Genitourinary:  Negative for hematuria.  Neurological:  Negative for dizziness, light-headedness and headaches (occ).       Objective:   Vitals:   02/15/22 1025  BP: 116/80  Pulse: 60  Temp: 98.1 F (36.7 C)  SpO2: 98%   BP Readings from Last 3 Encounters:  02/15/22 116/80  12/11/21 110/70  10/11/21 108/68   Wt Readings from Last 3 Encounters:  02/15/22 152 lb 3.2 oz (69 kg)  12/11/21 151 lb 12.8 oz (68.9 kg)  10/11/21 154 lb 12.8 oz (70.2 kg)   Body mass index is 25.33 kg/m.    Physical Exam Constitutional:      General: She is not in acute distress.    Appearance: Normal appearance.  HENT:     Head: Normocephalic and  atraumatic.  Eyes:     Conjunctiva/sclera: Conjunctivae normal.  Cardiovascular:     Rate and Rhythm: Normal rate and regular rhythm.     Heart sounds: Normal heart sounds. No murmur heard. Pulmonary:     Effort: Pulmonary effort is normal. No respiratory distress.     Breath sounds: Normal breath sounds. No wheezing.  Abdominal:     General: There is no distension.     Palpations: Abdomen is soft.     Tenderness: There is no abdominal tenderness.  Musculoskeletal:     Cervical back: Neck supple.     Right lower leg: No edema.     Left lower leg: No edema.  Lymphadenopathy:     Cervical: No cervical adenopathy.  Skin:    General: Skin is warm and dry.     Findings: No rash.  Neurological:     Mental Status: She is alert.            Assessment & Plan:    See Problem List for Assessment and Plan of chronic medical problems.

## 2022-02-15 ENCOUNTER — Ambulatory Visit: Payer: 59 | Admitting: Internal Medicine

## 2022-02-15 ENCOUNTER — Encounter: Payer: Self-pay | Admitting: Internal Medicine

## 2022-02-15 DIAGNOSIS — D649 Anemia, unspecified: Secondary | ICD-10-CM

## 2022-02-15 LAB — CBC WITH DIFFERENTIAL/PLATELET
Basophils Absolute: 0.1 10*3/uL (ref 0.0–0.1)
Basophils Relative: 1.4 % (ref 0.0–3.0)
Eosinophils Absolute: 0.2 10*3/uL (ref 0.0–0.7)
Eosinophils Relative: 4.3 % (ref 0.0–5.0)
HCT: 35.4 % — ABNORMAL LOW (ref 36.0–46.0)
Hemoglobin: 11.5 g/dL — ABNORMAL LOW (ref 12.0–15.0)
Lymphocytes Relative: 30.3 % (ref 12.0–46.0)
Lymphs Abs: 1.2 10*3/uL (ref 0.7–4.0)
MCHC: 32.4 g/dL (ref 30.0–36.0)
MCV: 85.2 fl (ref 78.0–100.0)
Monocytes Absolute: 0.4 10*3/uL (ref 0.1–1.0)
Monocytes Relative: 9 % (ref 3.0–12.0)
Neutro Abs: 2.2 10*3/uL (ref 1.4–7.7)
Neutrophils Relative %: 55 % (ref 43.0–77.0)
Platelets: 328 10*3/uL (ref 150.0–400.0)
RBC: 4.15 Mil/uL (ref 3.87–5.11)
RDW: 14.8 % (ref 11.5–15.5)
WBC: 4.1 10*3/uL (ref 4.0–10.5)

## 2022-02-15 LAB — COMPREHENSIVE METABOLIC PANEL
ALT: 18 U/L (ref 0–35)
AST: 22 U/L (ref 0–37)
Albumin: 4.2 g/dL (ref 3.5–5.2)
Alkaline Phosphatase: 84 U/L (ref 39–117)
BUN: 11 mg/dL (ref 6–23)
CO2: 28 mEq/L (ref 19–32)
Calcium: 9 mg/dL (ref 8.4–10.5)
Chloride: 104 mEq/L (ref 96–112)
Creatinine, Ser: 0.69 mg/dL (ref 0.40–1.20)
GFR: 94.12 mL/min (ref >60.00)
Glucose, Bld: 87 mg/dL (ref 70–99)
Potassium: 4.3 mEq/L (ref 3.5–5.1)
Sodium: 140 mEq/L (ref 135–145)
Total Bilirubin: 0.4 mg/dL (ref 0.2–1.2)
Total Protein: 7.2 g/dL (ref 6.0–8.3)

## 2022-02-15 LAB — IBC PANEL
Iron: 35 ug/dL — ABNORMAL LOW (ref 42–145)
Saturation Ratios: 6.4 % — ABNORMAL LOW (ref 20.0–50.0)
TIBC: 543.2 ug/dL — ABNORMAL HIGH (ref 250.0–450.0)
Transferrin: 388 mg/dL — ABNORMAL HIGH (ref 212.0–360.0)

## 2022-02-15 LAB — FOLATE: Folate: 10.7 ng/mL (ref >5.9)

## 2022-02-15 LAB — FERRITIN: Ferritin: 4.1 ng/mL — ABNORMAL LOW (ref 10.0–291.0)

## 2022-02-15 LAB — VITAMIN B12: Vitamin B-12: 745 pg/mL (ref 211–911)

## 2022-02-15 NOTE — Patient Instructions (Addendum)
    Call GI Phone: [phone]  - call to schedule your colonoscopy   Blood work was ordered.

## 2022-02-15 NOTE — Assessment & Plan Note (Signed)
Slightly anemia earlier this year - now hgb around 10 at gyn's office No symptoms c/w bleeding Unlikely nutritional deficiency Due for colonoscopy - will call today to schedule - concern for GI bleed Cbc iron panel, folate, B12 hemoccult stool cards Will call GI to schedule a colonoscopy

## 2022-02-21 ENCOUNTER — Other Ambulatory Visit (HOSPITAL_COMMUNITY): Payer: Self-pay | Admitting: Obstetrics and Gynecology

## 2022-02-21 ENCOUNTER — Other Ambulatory Visit: Payer: Self-pay | Admitting: Obstetrics and Gynecology

## 2022-02-21 DIAGNOSIS — Z8249 Family history of ischemic heart disease and other diseases of the circulatory system: Secondary | ICD-10-CM

## 2022-02-22 ENCOUNTER — Other Ambulatory Visit (INDEPENDENT_AMBULATORY_CARE_PROVIDER_SITE_OTHER): Payer: 59

## 2022-02-22 DIAGNOSIS — D649 Anemia, unspecified: Secondary | ICD-10-CM

## 2022-02-22 LAB — HEMOCCULT SLIDES (X 3 CARDS)
Fecal Occult Blood: NEGATIVE
OCCULT 1: NEGATIVE
OCCULT 2: NEGATIVE
OCCULT 3: NEGATIVE
OCCULT 4: NEGATIVE
OCCULT 5: NEGATIVE

## 2022-02-27 ENCOUNTER — Other Ambulatory Visit (HOSPITAL_COMMUNITY): Payer: Self-pay

## 2022-02-27 ENCOUNTER — Ambulatory Visit (AMBULATORY_SURGERY_CENTER): Payer: Self-pay | Admitting: *Deleted

## 2022-02-27 VITALS — Ht 65.0 in | Wt 153.0 lb

## 2022-02-27 DIAGNOSIS — Z1211 Encounter for screening for malignant neoplasm of colon: Secondary | ICD-10-CM

## 2022-02-27 MED ORDER — NA SULFATE-K SULFATE-MG SULF 17.5-3.13-1.6 GM/177ML PO SOLN
1.0000 | ORAL | 0 refills | Status: DC
  Filled 2022-02-27: qty 354, 1d supply, fill #0

## 2022-02-27 NOTE — Progress Notes (Signed)
Patient is here in-person for PV. Patient denies any allergies to eggs or soy. Patient denies any problems with anesthesia/sedation. Patient is not on any oxygen at home. Patient is not taking any diet/weight loss medications or blood thinners. Went over procedure prep instructions with the patient. Patient is aware of our care-partner policy.

## 2022-03-01 ENCOUNTER — Encounter: Payer: Self-pay | Admitting: Internal Medicine

## 2022-03-30 ENCOUNTER — Encounter: Payer: Self-pay | Admitting: Certified Registered Nurse Anesthetist

## 2022-04-02 DIAGNOSIS — D1801 Hemangioma of skin and subcutaneous tissue: Secondary | ICD-10-CM | POA: Diagnosis not present

## 2022-04-02 DIAGNOSIS — L821 Other seborrheic keratosis: Secondary | ICD-10-CM | POA: Diagnosis not present

## 2022-04-02 DIAGNOSIS — D220 Melanocytic nevi of lip: Secondary | ICD-10-CM | POA: Diagnosis not present

## 2022-04-03 ENCOUNTER — Ambulatory Visit (AMBULATORY_SURGERY_CENTER): Payer: 59 | Admitting: Internal Medicine

## 2022-04-03 ENCOUNTER — Encounter: Payer: Self-pay | Admitting: Internal Medicine

## 2022-04-03 VITALS — BP 115/65 | HR 53 | Temp 98.2°F | Resp 12 | Ht 65.0 in | Wt 153.0 lb

## 2022-04-03 DIAGNOSIS — D123 Benign neoplasm of transverse colon: Secondary | ICD-10-CM

## 2022-04-03 DIAGNOSIS — D122 Benign neoplasm of ascending colon: Secondary | ICD-10-CM | POA: Diagnosis not present

## 2022-04-03 DIAGNOSIS — Z1211 Encounter for screening for malignant neoplasm of colon: Secondary | ICD-10-CM

## 2022-04-03 MED ORDER — SODIUM CHLORIDE 0.9 % IV SOLN
500.0000 mL | Freq: Once | INTRAVENOUS | Status: DC
Start: 2022-04-03 — End: 2022-04-03

## 2022-04-03 NOTE — Progress Notes (Signed)
Pt's states no medical or surgical changes since previsit or office visit. 

## 2022-04-03 NOTE — Op Note (Signed)
[facility] Patient Name: Shelby Calhoun Procedure Date: 04/03/2022 3:59 PM MRN: [id number] Endoscopist: Gatha Mayer , MD Age: 61 Referring MD:  Date of Birth: [birth year] Gender: Female Account #: [id number] Procedure:                Colonoscopy Indications:              Screening for colorectal malignant neoplasm, Last                            colonoscopy: 2013 Medicines:                Monitored Anesthesia Care Procedure:                Pre-Anesthesia Assessment:                           - Prior to the procedure, a History and Physical                            was performed, and patient medications and                            allergies were reviewed. The patient's tolerance of                            previous anesthesia was also reviewed. The risks                            and benefits of the procedure and the sedation                            options and risks were discussed with the patient.                            All questions were answered, and informed consent                            was obtained. Prior Anticoagulants: The patient has                            taken no previous anticoagulant or antiplatelet                            agents. ASA Grade Assessment: I - A normal, healthy                            patient. After reviewing the risks and benefits,                            the patient was deemed in satisfactory condition to                            undergo the procedure.  After obtaining informed consent, the colonoscope                            was passed under direct vision. Throughout the                            procedure, the patient's blood pressure, pulse, and                            oxygen saturations were monitored continuously. The                            CF [id number] #[id number] was introduced through the anus                            and advanced to the the cecum, identified by                             appendiceal orifice and ileocecal valve. The                            colonoscopy was performed without difficulty. The                            patient tolerated the procedure well. The quality                            of the bowel preparation was excellent. The bowel                            preparation used was SUPREP via split dose                            instruction. The appendiceal orifice and the rectum                            were photographed. Scope In: 4:10:20 PM Scope Out: 4:28:22 PM Scope Withdrawal Time: 0 hours 13 minutes 31 seconds  Total Procedure Duration: 0 hours 18 minutes 2 seconds  Findings:                 The perianal and digital rectal examinations were                            normal.                           Two sessile polyps were found in the transverse                            colon and ascending colon. The polyps were 1 to 3                            mm in size. These polyps were removed with a cold  snare. Resection and retrieval were complete.                            Verification of patient identification for the                            specimen was done. Estimated blood loss was minimal.                           The exam was otherwise without abnormality on                            direct and retroflexion views. Complications:            No immediate complications. Estimated Blood Loss:     Estimated blood loss was minimal. Impression:               - Two 1 to 3 mm polyps in the transverse colon and                            in the ascending colon, removed with a cold snare.                            Resected and retrieved.                           - The examination was otherwise normal on direct                            and retroflexion views.                           - Recently found to have mild anemia and iron                            deficinecy - no sig GI signs/sxs -  has been                            donating blood so suspcet related to that Recommendation:           - Patient has a contact number available for                            emergencies. The signs and symptoms of potential                            delayed complications were discussed with the                            patient. Return to normal activities tomorrow.                            Written discharge instructions were provided to the  patient.                           - Resume previous diet.                           - Continue present medications.                           - Repeat colonoscopy is recommended. The                            colonoscopy date will be determined after pathology                            results from today's exam become available for                            review. Gatha Mayer, MD 04/03/2022 4:36:52 PM This report has been signed electronically.

## 2022-04-03 NOTE — Progress Notes (Unsigned)
Report given to PACU, vss 

## 2022-04-03 NOTE — Progress Notes (Signed)
Called to room to assist during endoscopic procedure.  Patient ID and intended procedure confirmed with present staff. Received instructions for my participation in the procedure from the performing physician.  

## 2022-04-03 NOTE — Progress Notes (Signed)
[facility] History and Physical   Primary Care Physician:  Binnie Rail, MD   Reason for Procedure:   CRCA screening  Plan:    colonoscopy     HPI: Shelby Calhoun is a 61 y.o. female here for screening exam.   Past Medical History:  Diagnosis Date   DJD (degenerative joint disease) of hip    Dr Maureen Ralphs   Hyperlipidemia     Past Surgical History:  Procedure Laterality Date   BUNIONECTOMY     Dr Blenda Mounts   COLONOSCOPY  2013   negative; Dr Carlean Purl   G 3 P 1     [facility]  04/28/2012   Procedure: TOTAL HIP ARTHROPLASTY ANTERIOR APPROACH;  Surgeon: Mauri Pole, MD;  Location: [facility];  Service: Orthopedics;  Laterality: Right;    Prior to Admission medications   Medication Sig Start Date End Date Taking? Authorizing Provider  Cyanocobalamin (VITAMIN B12) 1000 MCG TBCR Take by mouth.   Yes [provider]  glucosamine-chondroitin 500-400 MG tablet Take by mouth.   Yes [provider]  VITAMIN D, CHOLECALCIFEROL, PO Take 2,000 Units by mouth daily.   Yes [provider]    Current Outpatient Medications  Medication Sig Dispense Refill   Cyanocobalamin (VITAMIN B12) 1000 MCG TBCR Take by mouth.     glucosamine-chondroitin 500-400 MG tablet Take by mouth.     VITAMIN D, CHOLECALCIFEROL, PO Take 2,000 Units by mouth daily.     Current Facility-Administered Medications  Medication Dose Route Frequency Provider Last Rate Last Admin   0.9 %  sodium chloride infusion  500 mL Intravenous Once Gatha Mayer, MD        Allergies as of 04/03/2022   (No Known Allergies)    Family History  Problem Relation Age of Onset   Stroke Mother 51   Hypertension Mother    Hyperlipidemia Mother    Osteoporosis Mother    Heart attack Father 75   Breast cancer Sister    Hyperlipidemia Sister    Hypertension Sister    Diabetes Sister    Bladder Cancer Sister    Colon cancer Maternal  Grandfather    Cancer Maternal Grandfather         intraabdominal   Esophageal cancer Neg Hx    Stomach cancer Neg Hx    Rectal cancer Neg Hx     Social History   Socioeconomic History   Marital status: Married    Spouse name: Not on file   Number of children: Not on file   Years of education: Not on file   Highest education level: Not on file  Occupational History   Not on file  Tobacco Use   Smoking status: Never   Smokeless tobacco: Never  Substance and Sexual Activity   Alcohol use: Yes    Alcohol/week: 2.0 standard drinks of alcohol    Types: 2 Glasses of wine per week    Comment:     Drug use: No   Sexual activity: Yes  Other Topics Concern   Not on file  Social History Narrative   Not on file   Social Determinants of Health   Financial Resource Strain: Not on file  Food Insecurity: Not on file  Transportation Needs: Not on file  Physical Activity: Not on file  Stress: Not on file  Social Connections: Not on file  Intimate Partner Violence: Not on file    Review  of Systems:  All other review of systems negative except as mentioned in the HPI.  Physical Exam: Vital signs BP 105/62   Pulse 77   Temp 98.2 F (36.8 C)   Ht '5\' 5"'$  (1.651 m)   Wt 153 lb (69.4 kg)   LMP 10/30/2012   SpO2 100%   BMI 25.46 kg/m   General:   Alert,  Well-developed, well-nourished, pleasant and cooperative in NAD Lungs:  Clear throughout to auscultation.   Heart:  Regular rate and rhythm; no murmurs, clicks, rubs,  or gallops. Abdomen:  Soft, nontender and nondistended. Normal bowel sounds.   Neuro/Psych:  Alert and cooperative. Normal mood and affect. A and O x 3   '@Larrell Rapozo'$  Simonne Maffucci, MD, [facility] [phone] (pager) 04/03/2022 3:59 PM@

## 2022-04-03 NOTE — Patient Instructions (Addendum)
I found and removed two very small polyps - 1 mm and 3 mm in size. All else ok.  I suspect low iron was from blood donation.  I appreciate the opportunity to care for you. Gatha Mayer, MD, [facility]  Handout provided on polyps.   Continue present medications. Resume previous diet.   No more blood donations.  Get over the counter ferrous sulfate (iron) and take daily. Recheck iron levels in a few months.   YOU HAD AN ENDOSCOPIC PROCEDURE TODAY AT [facility]:   Refer to the procedure report that was given to you for any specific questions about what was found during the examination.  If the procedure report does not answer your questions, please call your gastroenterologist to clarify.  If you requested that your care partner not be given the details of your procedure findings, then the procedure report has been included in a sealed envelope for you to review at your convenience later.  YOU SHOULD EXPECT: Some feelings of bloating in the abdomen. Passage of more gas than usual.  Walking can help get rid of the air that was put into your GI tract during the procedure and reduce the bloating. If you had a lower endoscopy (such as a colonoscopy or flexible sigmoidoscopy) you may notice spotting of blood in your stool or on the toilet paper. If you underwent a bowel prep for your procedure, you may not have a normal bowel movement for a few days.  Please Note:  You might notice some irritation and congestion in your nose or some drainage.  This is from the oxygen used during your procedure.  There is no need for concern and it should clear up in a day or so.  SYMPTOMS TO REPORT IMMEDIATELY:  Following lower endoscopy (colonoscopy or flexible sigmoidoscopy):  Excessive amounts of blood in the stool  Significant tenderness or worsening of abdominal pains  Swelling of the abdomen that is new, acute  Fever of 100F or higher  For urgent or emergent issues, a gastroenterologist can  be reached at any hour by calling [phone]. Do not use MyChart messaging for urgent concerns.    DIET:  We do recommend a small meal at first, but then you may proceed to your regular diet.  Drink plenty of fluids but you should avoid alcoholic beverages for 24 hours.  ACTIVITY:  You should plan to take it easy for the rest of today and you should NOT DRIVE or use heavy machinery until tomorrow (because of the sedation medicines used during the test).    FOLLOW UP: Our staff will call the number listed on your records the next business day following your procedure.  We will call around 7:15- 8:00 am to check on you and address any questions or concerns that you may have regarding the information given to you following your procedure. If we do not reach you, we will leave a message.     If any biopsies were taken you will be contacted by phone or by letter within the next 1-3 weeks.  Please call us at [phone] if you have not heard about the biopsies in 3 weeks.    SIGNATURES/CONFIDENTIALITY: You and/or your care partner have signed paperwork which will be entered into your electronic medical record.  These signatures attest to the fact that that the information above on your After Visit Summary has been reviewed and is understood.  Full responsibility of the confidentiality of this discharge information  lies with you and/or your care-partner.

## 2022-04-04 ENCOUNTER — Telehealth: Payer: Self-pay

## 2022-04-04 NOTE — Telephone Encounter (Signed)
  Follow up Call-     04/03/2022    3:25 PM  Call back number  Post procedure Call Back phone  # [phone]  Permission to leave phone message Yes     Patient questions:  Do you have a fever, pain , or abdominal swelling? No. Pain Score  0 *  Have you tolerated food without any problems? Yes.    Have you been able to return to your normal activities? Yes.    Do you have any questions about your discharge instructions: Diet   No. Medications  No. Follow up visit  No.  Do you have questions or concerns about your Care? No.  Actions: * If pain score is 4 or above: No action needed, pain <4.

## 2022-04-10 ENCOUNTER — Encounter: Payer: Self-pay | Admitting: Internal Medicine

## 2022-04-10 DIAGNOSIS — Z860101 Personal history of adenomatous and serrated colon polyps: Secondary | ICD-10-CM

## 2022-04-10 DIAGNOSIS — Z86010 Personal history of colonic polyps: Secondary | ICD-10-CM | POA: Insufficient documentation

## 2022-04-10 HISTORY — DX: Personal history of adenomatous and serrated colon polyps: Z86.0101

## 2022-10-13 ENCOUNTER — Encounter: Payer: Self-pay | Admitting: Internal Medicine

## 2022-10-13 NOTE — Patient Instructions (Addendum)
Blood work was ordered.   The lab is on the first floor.    Medications changes include :   none     Return in about 1 year (around 10/14/2023) for Physical Exam.   Health Maintenance, Female Adopting a healthy lifestyle and getting preventive care are important in promoting health and wellness. Ask your health care provider about: The right schedule for you to have regular tests and exams. Things you can do on your own to prevent diseases and keep yourself healthy. What should I know about diet, weight, and exercise? Eat a healthy diet  Eat a diet that includes plenty of vegetables, fruits, low-fat dairy products, and lean protein. Do not eat a lot of foods that are high in solid fats, added sugars, or sodium. Maintain a healthy weight Body mass index (BMI) is used to identify weight problems. It estimates body fat based on height and weight. Your health care provider can help determine your BMI and help you achieve or maintain a healthy weight. Get regular exercise Get regular exercise. This is one of the most important things you can do for your health. Most adults should: Exercise for at least 150 minutes each week. The exercise should increase your heart rate and make you sweat (moderate-intensity exercise). Do strengthening exercises at least twice a week. This is in addition to the moderate-intensity exercise. Spend less time sitting. Even light physical activity can be beneficial. Watch cholesterol and blood lipids Have your blood tested for lipids and cholesterol at 62 years of age, then have this test every 5 years. Have your cholesterol levels checked more often if: Your lipid or cholesterol levels are high. You are older than 62 years of age. You are at high risk for heart disease. What should I know about cancer screening? Depending on your health history and family history, you may need to have cancer screening at various ages. This may include screening  for: Breast cancer. Cervical cancer. Colorectal cancer. Skin cancer. Lung cancer. What should I know about heart disease, diabetes, and high blood pressure? Blood pressure and heart disease High blood pressure causes heart disease and increases the risk of stroke. This is more likely to develop in people who have high blood pressure readings or are overweight. Have your blood pressure checked: Every 3-5 years if you are 13-68 years of age. Every year if you are 46 years old or older. Diabetes Have regular diabetes screenings. This checks your fasting blood sugar level. Have the screening done: Once every three years after age 73 if you are at a normal weight and have a low risk for diabetes. More often and at a younger age if you are overweight or have a high risk for diabetes. What should I know about preventing infection? Hepatitis B If you have a higher risk for hepatitis B, you should be screened for this virus. Talk with your health care provider to find out if you are at risk for hepatitis B infection. Hepatitis C Testing is recommended for: Everyone born from [birth year] through 1965. Anyone with known risk factors for hepatitis C. Sexually transmitted infections (STIs) Get screened for STIs, including gonorrhea and chlamydia, if: You are sexually active and are younger than 62 years of age. You are older than 62 years of age and your health care provider tells you that you are at risk for this type of infection. Your sexual activity has changed since you were last screened, and you are at  increased risk for chlamydia or gonorrhea. Ask your health care provider if you are at risk. Ask your health care provider about whether you are at high risk for HIV. Your health care provider may recommend a prescription medicine to help prevent HIV infection. If you choose to take medicine to prevent HIV, you should first get tested for HIV. You should then be tested every 3 months for as long as you  are taking the medicine. Pregnancy If you are about to stop having your period (premenopausal) and you may become pregnant, seek counseling before you get pregnant. Take 400 to 800 micrograms (mcg) of folic acid every day if you become pregnant. Ask for birth control (contraception) if you want to prevent pregnancy. Osteoporosis and menopause Osteoporosis is a disease in which the bones lose minerals and strength with aging. This can result in bone fractures. If you are 62 years old or older, or if you are at risk for osteoporosis and fractures, ask your health care provider if you should: Be screened for bone loss. Take a calcium or vitamin D supplement to lower your risk of fractures. Be given hormone replacement therapy (HRT) to treat symptoms of menopause. Follow these instructions at home: Alcohol use Do not drink alcohol if: Your health care provider tells you not to drink. You are pregnant, may be pregnant, or are planning to become pregnant. If you drink alcohol: Limit how much you have to: 0-1 drink a day. Know how much alcohol is in your drink. In the [place] one drink equals one 12 oz bottle of beer (355 mL), one 5 oz glass of wine (148 mL), or one 1 oz glass of hard liquor (44 mL). Lifestyle Do not use any products that contain nicotine or tobacco. These products include cigarettes, chewing tobacco, and vaping devices, such as e-cigarettes. If you need help quitting, ask your health care provider. Do not use street drugs. Do not share needles. Ask your health care provider for help if you need support or information about quitting drugs. General instructions Schedule regular health, dental, and eye exams. Stay current with your vaccines. Tell your health care provider if: You often feel depressed. You have ever been abused or do not feel safe at home. Summary Adopting a healthy lifestyle and getting preventive care are important in promoting health and wellness. Follow your  health care provider's instructions about healthy diet, exercising, and getting tested or screened for diseases. Follow your health care provider's instructions on monitoring your cholesterol and blood pressure. This information is not intended to replace advice given to you by your health care provider. Make sure you discuss any questions you have with your health care provider. Document Revised: 10/23/2020 Document Reviewed: 10/23/2020 Elsevier Patient Education  [employer].

## 2022-10-13 NOTE — Progress Notes (Unsigned)
Subjective:    Patient ID: Shelby Calhoun, female    DOB: May 11, 1961, 62 y.o.   MRN: [id number]      HPI Nannette is here for a Physical exam and her chronic medical problems.    No concerns    Medications and allergies reviewed with patient and updated if appropriate.  Current Outpatient Medications on File Prior to Visit  Medication Sig Dispense Refill   Cyanocobalamin (VITAMIN B12) 1000 MCG TBCR Take by mouth.     glucosamine-chondroitin 500-400 MG tablet Take by mouth.     VITAMIN D, CHOLECALCIFEROL, PO Take 2,000 Units by mouth daily.     No current facility-administered medications on file prior to visit.    Review of Systems  Constitutional:  Negative for fever.  Eyes:  Negative for visual disturbance.  Respiratory:  Negative for cough, shortness of breath and wheezing.   Cardiovascular:  Negative for chest pain, palpitations and leg swelling.  Gastrointestinal:  Negative for abdominal pain, blood in stool, constipation and diarrhea.       No gerd  Genitourinary:  Negative for dysuria.  Musculoskeletal:  Negative for arthralgias and back pain.  Skin:  Negative for rash.  Neurological:  Negative for dizziness, light-headedness, numbness and headaches.  Psychiatric/Behavioral:  Negative for dysphoric mood. The patient is not nervous/anxious.        Objective:   Vitals:   10/14/22 0753  BP: 110/78  Pulse: 62  Temp: 98.1 F (36.7 C)  SpO2: 97%   Filed Weights   10/14/22 0753  Weight: 160 lb (72.6 kg)   Body mass index is 26.63 kg/m.  BP Readings from Last 3 Encounters:  10/14/22 110/78  04/03/22 115/65  02/15/22 116/80    Wt Readings from Last 3 Encounters:  10/14/22 160 lb (72.6 kg)  04/03/22 153 lb (69.4 kg)  02/27/22 153 lb (69.4 kg)       Physical Exam Constitutional: She appears well-developed and well-nourished. No distress.  HENT:  Head: Normocephalic and atraumatic.  Right Ear: External ear normal. Normal ear canal and  TM Left Ear: External ear normal.  Normal ear canal and TM Mouth/Throat: Oropharynx is clear and moist.  Eyes: Conjunctivae normal.  Neck: Neck supple. No tracheal deviation present. No thyromegaly present.  No carotid bruit  Cardiovascular: Normal rate, regular rhythm and normal heart sounds.   No murmur heard.  No edema. Pulmonary/Chest: Effort normal and breath sounds normal. No respiratory distress. She has no wheezes. She has no rales.  Breast: deferred   Abdominal: Soft. She exhibits no distension. There is no tenderness.  Lymphadenopathy: She has no cervical adenopathy.  Skin: Skin is warm and dry. She is not diaphoretic.  Psychiatric: She has a normal mood and affect. Her behavior is normal.     Lab Results  Component Value Date   WBC 4.1 02/15/2022   HGB 11.5 (L) 02/15/2022   HCT 35.4 (L) 02/15/2022   PLT 328.0 02/15/2022   GLUCOSE 87 02/15/2022   CHOL 234 (H) 10/11/2021   TRIG 95.0 10/11/2021   HDL 73.40 10/11/2021   LDLCALC 142 (H) 10/11/2021   ALT 18 02/15/2022   AST 22 02/15/2022   NA 140 02/15/2022   K 4.3 02/15/2022   CL 104 02/15/2022   CREATININE 0.69 02/15/2022   BUN 11 02/15/2022   CO2 28 02/15/2022   TSH 3.23 10/11/2021   INR 0.96 04/23/2012   HGBA1C 5.8 10/11/2021         Assessment &  Plan:   Physical exam: Screening blood work  ordered Exercise walking  Weight normal Substance abuse  none   Reviewed recommended immunizations.   Health Maintenance  Topic Date Due   Zoster Vaccines- Shingrix (1 of 2) 01/13/2023 (Originally 05/25/2011)   INFLUENZA VACCINE  01/16/2023   MAMMOGRAM  02/06/2024   PAP SMEAR-Modifier  02/05/2025   DTaP/Tdap/Td (3 - Td or Tdap) 10/29/2031   COLONOSCOPY (Pts 45-62yrs Insurance coverage will need to be confirmed)  04/03/2032   Hepatitis C Screening  Completed   HIV Screening  Completed   HPV VACCINES  Aged Out   COVID-19 Vaccine  Discontinued          See Problem List for Assessment and Plan of  chronic medical problems.

## 2022-10-14 ENCOUNTER — Ambulatory Visit (INDEPENDENT_AMBULATORY_CARE_PROVIDER_SITE_OTHER): Payer: Commercial Managed Care - PPO | Admitting: Internal Medicine

## 2022-10-14 VITALS — BP 110/78 | HR 62 | Temp 98.1°F | Ht 65.0 in | Wt 160.0 lb

## 2022-10-14 DIAGNOSIS — D509 Iron deficiency anemia, unspecified: Secondary | ICD-10-CM | POA: Diagnosis not present

## 2022-10-14 DIAGNOSIS — R7303 Prediabetes: Secondary | ICD-10-CM

## 2022-10-14 DIAGNOSIS — E538 Deficiency of other specified B group vitamins: Secondary | ICD-10-CM

## 2022-10-14 DIAGNOSIS — E782 Mixed hyperlipidemia: Secondary | ICD-10-CM | POA: Diagnosis not present

## 2022-10-14 DIAGNOSIS — Z0000 Encounter for general adult medical examination without abnormal findings: Secondary | ICD-10-CM

## 2022-10-14 LAB — CBC WITH DIFFERENTIAL/PLATELET
Basophils Absolute: 0 10*3/uL (ref 0.0–0.1)
Basophils Relative: 1 % (ref 0.0–3.0)
Eosinophils Absolute: 0.2 10*3/uL (ref 0.0–0.7)
Eosinophils Relative: 4.9 % (ref 0.0–5.0)
HCT: 39.9 % (ref 36.0–46.0)
Hemoglobin: 13.6 g/dL (ref 12.0–15.0)
Lymphocytes Relative: 26 % (ref 12.0–46.0)
Lymphs Abs: 1.2 10*3/uL (ref 0.7–4.0)
MCHC: 34 g/dL (ref 30.0–36.0)
MCV: 92.9 fl (ref 78.0–100.0)
Monocytes Absolute: 0.3 10*3/uL (ref 0.1–1.0)
Monocytes Relative: 7.1 % (ref 3.0–12.0)
Neutro Abs: 2.7 10*3/uL (ref 1.4–7.7)
Neutrophils Relative %: 61 % (ref 43.0–77.0)
Platelets: 289 10*3/uL (ref 150.0–400.0)
RBC: 4.3 Mil/uL (ref 3.87–5.11)
RDW: 12.9 % (ref 11.5–15.5)
WBC: 4.5 10*3/uL (ref 4.0–10.5)

## 2022-10-14 LAB — COMPREHENSIVE METABOLIC PANEL
ALT: 17 U/L (ref 0–35)
AST: 19 U/L (ref 0–37)
Albumin: 4.3 g/dL (ref 3.5–5.2)
Alkaline Phosphatase: 78 U/L (ref 39–117)
BUN: 16 mg/dL (ref 6–23)
CO2: 27 mEq/L (ref 19–32)
Calcium: 9 mg/dL (ref 8.4–10.5)
Chloride: 106 mEq/L (ref 96–112)
Creatinine, Ser: 0.7 mg/dL (ref 0.40–1.20)
GFR: 93.36 mL/min (ref >60.00)
Glucose, Bld: 100 mg/dL — ABNORMAL HIGH (ref 70–99)
Potassium: 4.3 mEq/L (ref 3.5–5.1)
Sodium: 141 mEq/L (ref 135–145)
Total Bilirubin: 0.4 mg/dL (ref 0.2–1.2)
Total Protein: 6.7 g/dL (ref 6.0–8.3)

## 2022-10-14 LAB — LIPID PANEL
Cholesterol: 230 mg/dL — ABNORMAL HIGH (ref 0–200)
HDL: 68.1 mg/dL (ref >39.00)
LDL Cholesterol: 150 mg/dL — ABNORMAL HIGH (ref 0–99)
NonHDL: 161.71
Total CHOL/HDL Ratio: 3
Triglycerides: 61 mg/dL (ref 0.0–149.0)
VLDL: 12.2 mg/dL (ref 0.0–40.0)

## 2022-10-14 LAB — HEMOGLOBIN A1C: Hgb A1c MFr Bld: 5.8 % (ref 4.6–6.5)

## 2022-10-14 LAB — IBC PANEL
Iron: 70 ug/dL (ref 42–145)
Saturation Ratios: 17.1 % — ABNORMAL LOW (ref 20.0–50.0)
TIBC: 408.8 ug/dL (ref 250.0–450.0)
Transferrin: 292 mg/dL (ref 212.0–360.0)

## 2022-10-14 LAB — VITAMIN B12: Vitamin B-12: 523 pg/mL (ref 211–911)

## 2022-10-14 LAB — FERRITIN: Ferritin: 23.2 ng/mL (ref 10.0–291.0)

## 2022-10-14 NOTE — Assessment & Plan Note (Signed)
Chronic Regular exercise and healthy diet encouraged Check lipid panel, CMP Diet controlled

## 2022-10-14 NOTE — Assessment & Plan Note (Signed)
Chronic No symptoms c/w bleeding Colonoscopy 03/2022-normal Cbc, iron panel

## 2022-10-14 NOTE — Assessment & Plan Note (Signed)
Chronic Supplementing B12 Check B12 level

## 2022-10-14 NOTE — Assessment & Plan Note (Signed)
Chronic Check a1c Low sugar / carb diet Stressed regular exercise  

## 2022-12-02 IMAGING — MR MR BREAST BILAT WO/W CM
7 of 9 series · 33 of 48 positions shown · IV contrast (7 ml gadavist)
Comparison: Previous exam(s).

CLINICAL DATA: 59-year-old female with high lifetime risk (36%) for
developing breast cancer, for screening breast MRI.

LABS:  Not applicable
EXAM:
BILATERAL BREAST MRI WITH AND WITHOUT CONTRAST
TECHNIQUE: Multiplanar, multisequence MR images of both breasts were obtained
prior to and following the intravenous administration of 7 ml of
Gadavist

[Series 2: t2_tirm_tra ipat (a-p) · axial · 3.0mm · 0.70mm/px · 1 of 52 slices shown]
[im 1/52]
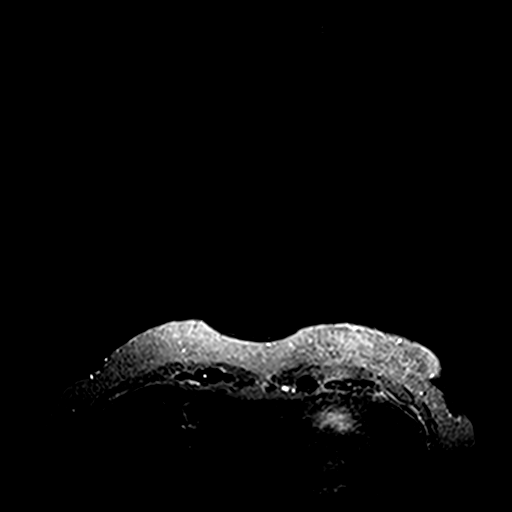

[Series 3: fl3d pre-cm no · axial · non-contrast · 1.2mm · 0.89mm/px · z∈[-91,+81]mm · 5 of 144 slices shown]
[im 1/144]
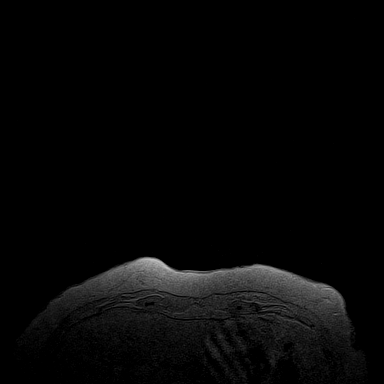
[im 36/144]
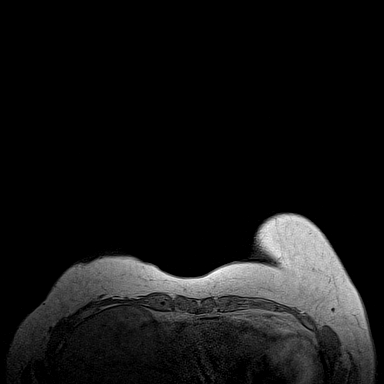
[im 72/144]
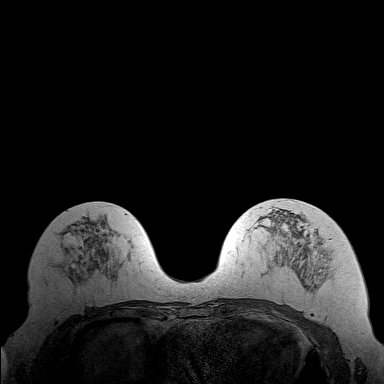
[im 108/144]
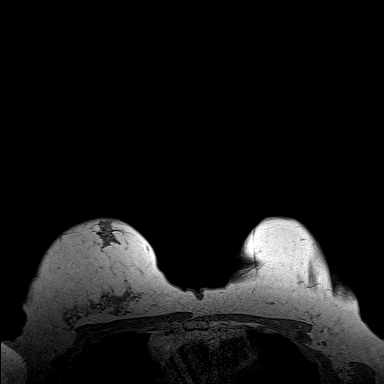
[im 144/144]
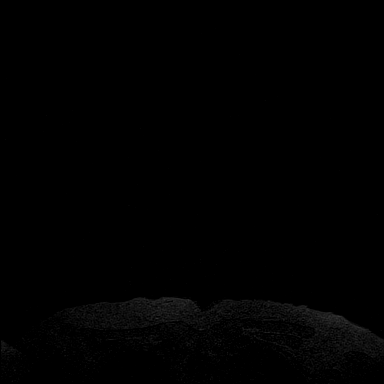

[Series 4: fl3d pre-cm · axial · non-contrast · 1.2mm · 0.89mm/px · z∈[-91,+81]mm · 6 of 144 slices shown]
[im 1/144]
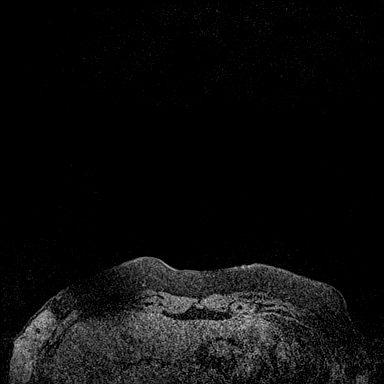
[im 29/144]
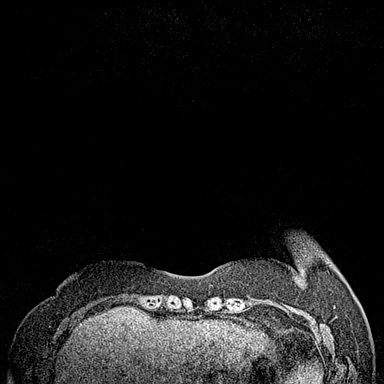
[im 58/144]
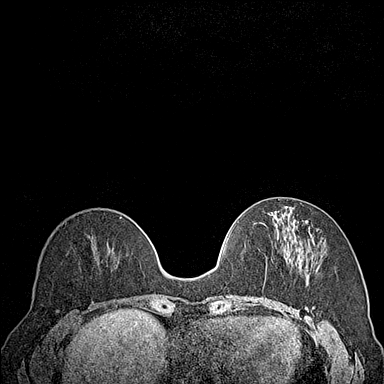
[im 86/144]
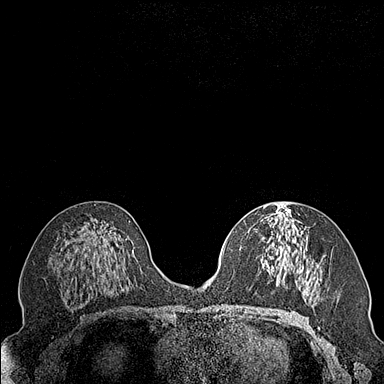
[im 115/144]
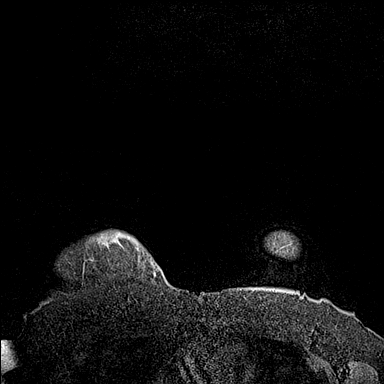
[im 144/144]
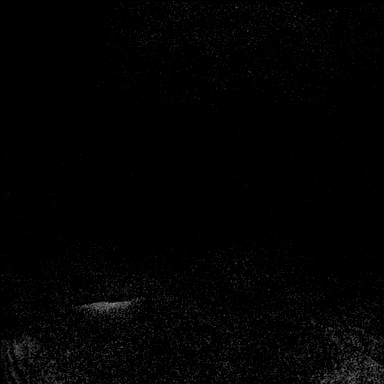

[Series 5: fl3d post-cm 20 · axial · 1.2mm · 0.89mm/px · z∈[-91,+81]mm · 6 of 144 slices shown (1 of 2)]
[im 1/144]
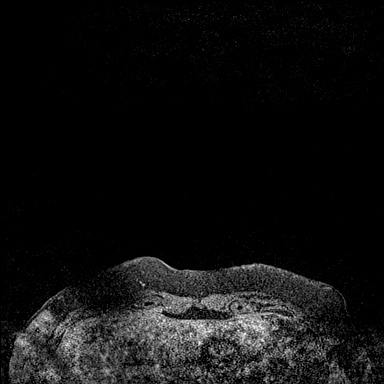
[im 29/144]
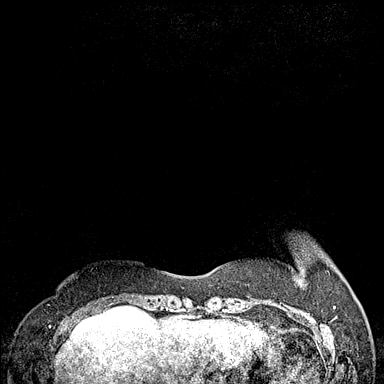
[im 58/144]
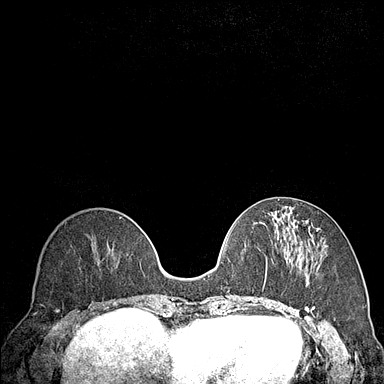
[im 86/144]
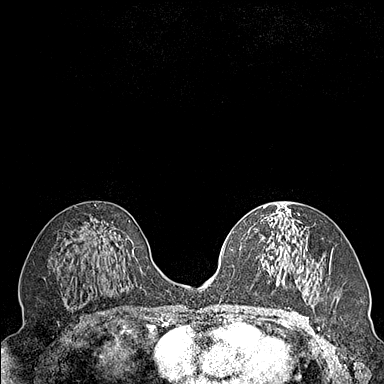
[im 115/144]
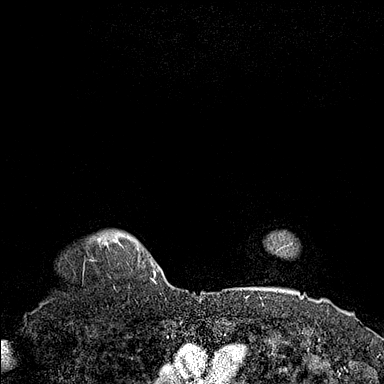
[im 144/144]
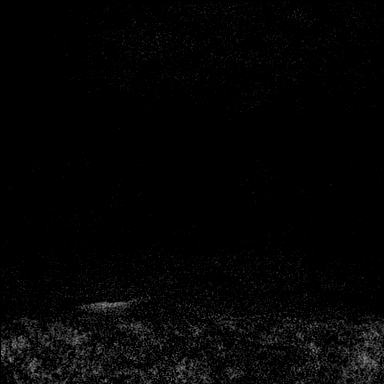

[Series 6: fl3d post-cm 20 · axial · 1.2mm · 0.89mm/px · z∈[-91,+81]mm · 6 of 144 slices shown (2 of 2)]
[im 1/144]
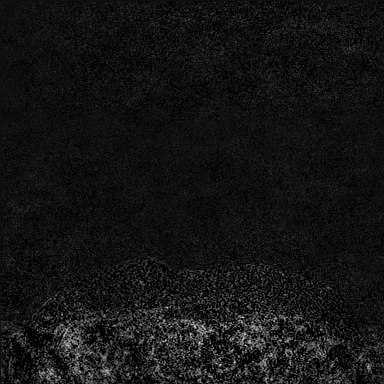
[im 29/144]
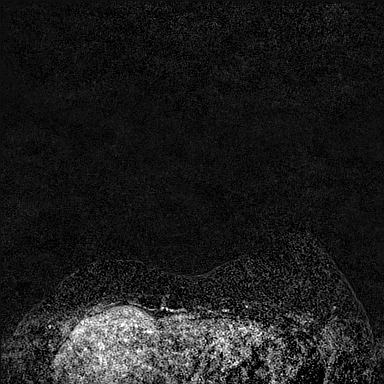
[im 58/144]
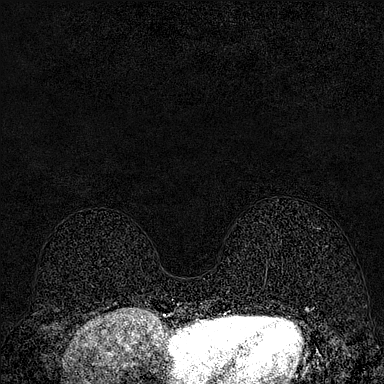
[im 86/144]
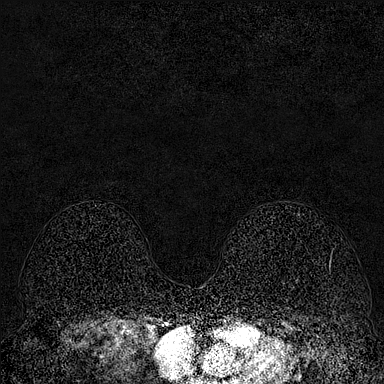
[im 115/144]
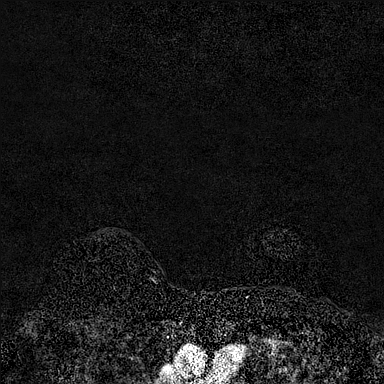
[im 144/144]
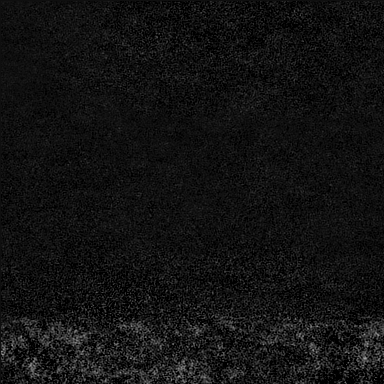

[Series 8: fl3d post-cm 3 · axial · 1.2mm · 0.89mm/px · z∈[-91,+81]mm · 6 of 144 slices shown (1 of 2)]
[im 1/144]
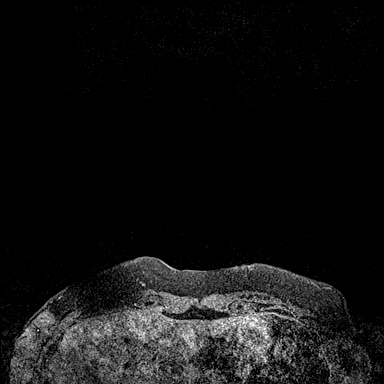
[im 29/144]
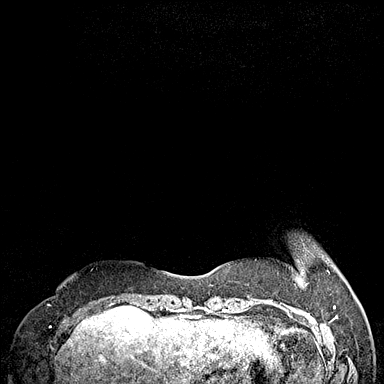
[im 58/144]
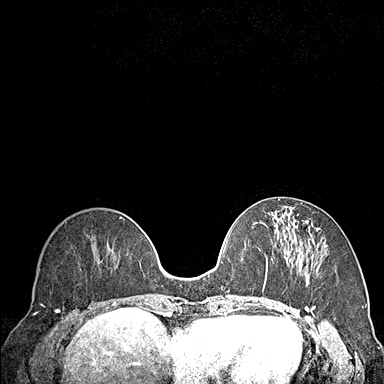
[im 86/144]
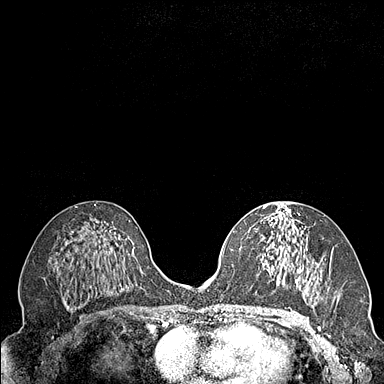
[im 115/144]
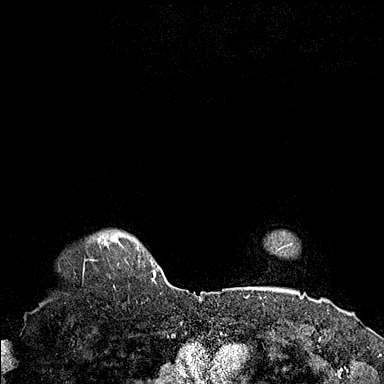
[im 144/144]
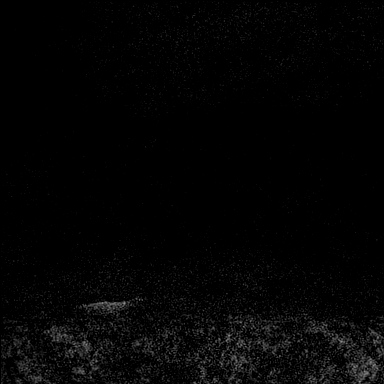

[Series 9: fl3d post-cm 3 · axial · 1.2mm · 0.89mm/px · z∈[-91,-23]mm · 3 of 144 slices shown (2 of 2)]
[im 1/144]
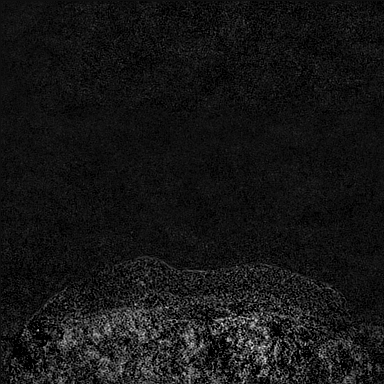
[im 29/144]
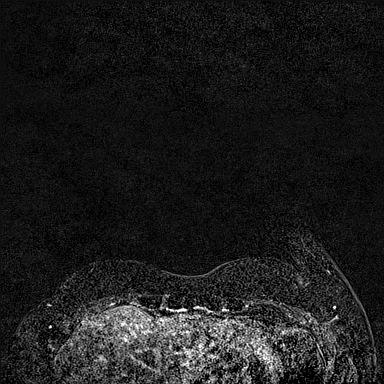
[im 58/144]
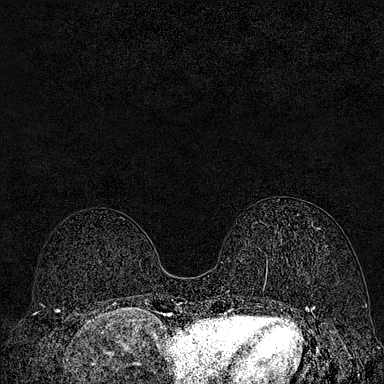

[33 of 48 positions shown; findings below may reference images not displayed]

Three-dimensional MR images were rendered by post-processing of the
original MR data on an independent workstation. The
three-dimensional MR images were interpreted, and findings are
reported in the following complete MRI report for this study. Three
dimensional images were evaluated at the independent interpreting
workstation using the [employer] thin client.
FINDINGS: Breast composition: c. Heterogeneous fibroglandular tissue.

Background parenchymal enhancement: Minimal

Right breast: No mass or abnormal enhancement.

Left breast: No mass or abnormal enhancement.

Lymph nodes: No abnormal appearing lymph nodes.

Ancillary findings:  None.
IMPRESSION: No MR evidence of breast malignancy.

RECOMMENDATION:
Bilateral screening mammogram in November 2021.

Bilateral screening breast MRI in 1 year.

BI-RADS CATEGORY  1: Negative.

## 2022-12-09 ENCOUNTER — Encounter: Payer: Self-pay | Admitting: Internal Medicine

## 2022-12-11 NOTE — Telephone Encounter (Signed)
A representative from [place] called with the patient about the charge. She said the patient was being charged due to the diagnosis codes. They would like someone to call the patient back at [phone]. If needing to speak with insurance, they said to call the provider's line at [phone].

## 2023-03-03 ENCOUNTER — Other Ambulatory Visit (HOSPITAL_COMMUNITY): Payer: Self-pay

## 2023-03-03 MED ORDER — CHLORHEXIDINE GLUCONATE 0.12 % MT SOLN
OROMUCOSAL | 0 refills | Status: AC
Start: 2023-03-03 — End: ?
  Filled 2023-03-03: qty 473, 14d supply, fill #0

## 2023-03-03 MED ORDER — AMOXICILLIN 500 MG PO CAPS
ORAL_CAPSULE | ORAL | 0 refills | Status: AC
Start: 2023-03-03 — End: ?
  Filled 2023-03-03: qty 25, 7d supply, fill #0

## 2023-03-04 ENCOUNTER — Other Ambulatory Visit (HOSPITAL_COMMUNITY): Payer: Self-pay | Admitting: Obstetrics and Gynecology

## 2023-03-04 DIAGNOSIS — Z6827 Body mass index (BMI) 27.0-27.9, adult: Secondary | ICD-10-CM | POA: Diagnosis not present

## 2023-03-04 DIAGNOSIS — Z01419 Encounter for gynecological examination (general) (routine) without abnormal findings: Secondary | ICD-10-CM | POA: Diagnosis not present

## 2023-03-04 DIAGNOSIS — Z1231 Encounter for screening mammogram for malignant neoplasm of breast: Secondary | ICD-10-CM | POA: Diagnosis not present

## 2023-03-04 DIAGNOSIS — Z8249 Family history of ischemic heart disease and other diseases of the circulatory system: Secondary | ICD-10-CM

## 2023-03-14 ENCOUNTER — Ambulatory Visit (HOSPITAL_COMMUNITY)
Admission: RE | Admit: 2023-03-14 | Discharge: 2023-03-14 | Disposition: A | Discharge status: Home / Self Care | Payer: Self-pay | Source: Ambulatory Visit | Attending: Obstetrics and Gynecology | Admitting: Obstetrics and Gynecology

## 2023-03-14 DIAGNOSIS — Z8249 Family history of ischemic heart disease and other diseases of the circulatory system: Secondary | ICD-10-CM | POA: Insufficient documentation

## 2023-04-03 ENCOUNTER — Encounter: Payer: Self-pay | Admitting: Internal Medicine

## 2023-09-10 ENCOUNTER — Other Ambulatory Visit (HOSPITAL_COMMUNITY): Payer: Self-pay

## 2023-09-10 MED ORDER — ZOSTER VAC RECOMB ADJUVANTED 50 MCG/0.5ML IM SUSR
0.5000 mL | Freq: Once | INTRAMUSCULAR | 0 refills | Status: AC
  Filled 2023-09-10: qty 0.5, 1d supply, fill #0

## 2023-10-29 DIAGNOSIS — M654 Radial styloid tenosynovitis [de Quervain]: Secondary | ICD-10-CM | POA: Diagnosis not present

## 2023-10-29 DIAGNOSIS — M25531 Pain in right wrist: Secondary | ICD-10-CM | POA: Diagnosis not present

## 2024-03-10 ENCOUNTER — Other Ambulatory Visit: Payer: Self-pay | Admitting: Obstetrics and Gynecology

## 2024-03-10 DIAGNOSIS — Z1151 Encounter for screening for human papillomavirus (HPV): Secondary | ICD-10-CM | POA: Diagnosis not present

## 2024-03-10 DIAGNOSIS — Z1231 Encounter for screening mammogram for malignant neoplasm of breast: Secondary | ICD-10-CM

## 2024-03-10 DIAGNOSIS — Z01419 Encounter for gynecological examination (general) (routine) without abnormal findings: Secondary | ICD-10-CM | POA: Diagnosis not present

## 2024-03-10 DIAGNOSIS — Z6827 Body mass index (BMI) 27.0-27.9, adult: Secondary | ICD-10-CM | POA: Diagnosis not present

## 2024-03-10 DIAGNOSIS — Z124 Encounter for screening for malignant neoplasm of cervix: Secondary | ICD-10-CM | POA: Diagnosis not present

## 2024-03-11 ENCOUNTER — Other Ambulatory Visit (HOSPITAL_COMMUNITY): Payer: Self-pay

## 2024-03-11 MED ORDER — ZOSTER VAC RECOMB ADJUVANTED 50 MCG/0.5ML IM SUSR
0.5000 mL | Freq: Once | INTRAMUSCULAR | 0 refills | Status: AC
Start: 2024-03-11 — End: 2024-03-12
  Filled 2024-03-11: qty 0.5, 1d supply, fill #0

## 2024-03-12 ENCOUNTER — Ambulatory Visit

## 2024-03-17 ENCOUNTER — Ambulatory Visit
Admission: RE | Admit: 2024-03-17 | Discharge: 2024-03-17 | Disposition: A | Source: Ambulatory Visit | Attending: Obstetrics and Gynecology | Admitting: Obstetrics and Gynecology

## 2024-03-17 DIAGNOSIS — Z1231 Encounter for screening mammogram for malignant neoplasm of breast: Secondary | ICD-10-CM | POA: Diagnosis not present

## 2024-05-26 DIAGNOSIS — M654 Radial styloid tenosynovitis [de Quervain]: Secondary | ICD-10-CM | POA: Diagnosis not present

## 2024-06-07 ENCOUNTER — Other Ambulatory Visit (HOSPITAL_COMMUNITY): Payer: Self-pay

## 2024-06-07 DIAGNOSIS — L814 Other melanin hyperpigmentation: Secondary | ICD-10-CM | POA: Diagnosis not present

## 2024-06-07 DIAGNOSIS — D2261 Melanocytic nevi of right upper limb, including shoulder: Secondary | ICD-10-CM | POA: Diagnosis not present

## 2024-06-07 DIAGNOSIS — Z129 Encounter for screening for malignant neoplasm, site unspecified: Secondary | ICD-10-CM | POA: Diagnosis not present

## 2024-06-07 DIAGNOSIS — L209 Atopic dermatitis, unspecified: Secondary | ICD-10-CM | POA: Diagnosis not present

## 2024-06-07 DIAGNOSIS — L821 Other seborrheic keratosis: Secondary | ICD-10-CM | POA: Diagnosis not present

## 2024-06-07 DIAGNOSIS — D2262 Melanocytic nevi of left upper limb, including shoulder: Secondary | ICD-10-CM | POA: Diagnosis not present

## 2024-06-07 DIAGNOSIS — D225 Melanocytic nevi of trunk: Secondary | ICD-10-CM | POA: Diagnosis not present

## 2024-06-07 DIAGNOSIS — L2089 Other atopic dermatitis: Secondary | ICD-10-CM | POA: Diagnosis not present

## 2024-06-07 DIAGNOSIS — D2239 Melanocytic nevi of other parts of face: Secondary | ICD-10-CM | POA: Diagnosis not present

## 2024-06-07 MED ORDER — TRIAMCINOLONE ACETONIDE 0.1 % EX CREA
TOPICAL_CREAM | CUTANEOUS | 3 refills | Status: AC
  Filled 2024-06-07: qty 80, 30d supply, fill #0

## 2024-06-16 LAB — OPHTHALMOLOGY REPORT-SCANNED
